# Patient Record
Sex: Male | Born: 1945 | Race: White | Hispanic: No | Marital: Single | Smoking: Former smoker
Health system: Southern US, Community
[De-identification: ages and names within clinical notes are randomized; demographics above are authoritative.]

## PROBLEM LIST (undated history)

## (undated) DIAGNOSIS — I2510 Atherosclerotic heart disease of native coronary artery without angina pectoris: Secondary | ICD-10-CM

## (undated) DIAGNOSIS — I350 Nonrheumatic aortic (valve) stenosis: Secondary | ICD-10-CM

## (undated) DIAGNOSIS — R058 Other specified cough: Secondary | ICD-10-CM

## (undated) DIAGNOSIS — E785 Hyperlipidemia, unspecified: Secondary | ICD-10-CM

## (undated) DIAGNOSIS — R05 Cough: Secondary | ICD-10-CM

## (undated) DIAGNOSIS — K922 Gastrointestinal hemorrhage, unspecified: Secondary | ICD-10-CM

## (undated) DIAGNOSIS — I219 Acute myocardial infarction, unspecified: Secondary | ICD-10-CM

## (undated) DIAGNOSIS — T464X5A Adverse effect of angiotensin-converting-enzyme inhibitors, initial encounter: Secondary | ICD-10-CM

## (undated) DIAGNOSIS — J449 Chronic obstructive pulmonary disease, unspecified: Secondary | ICD-10-CM

## (undated) DIAGNOSIS — E119 Type 2 diabetes mellitus without complications: Secondary | ICD-10-CM

## (undated) DIAGNOSIS — K219 Gastro-esophageal reflux disease without esophagitis: Secondary | ICD-10-CM

## (undated) DIAGNOSIS — I10 Essential (primary) hypertension: Secondary | ICD-10-CM

## (undated) HISTORY — DX: Cough: R05

## (undated) HISTORY — DX: Adverse effect of angiotensin-converting-enzyme inhibitors, initial encounter: T46.4X5A

## (undated) HISTORY — DX: Gastro-esophageal reflux disease without esophagitis: K21.9

## (undated) HISTORY — DX: Other specified cough: R05.8

## (undated) HISTORY — DX: Gastrointestinal hemorrhage, unspecified: K92.2

## (undated) HISTORY — DX: Chronic obstructive pulmonary disease, unspecified: J44.9

## (undated) HISTORY — DX: Acute myocardial infarction, unspecified: I21.9

## (undated) HISTORY — DX: Hyperlipidemia, unspecified: E78.5

## (undated) HISTORY — PX: HAND TENDON SURGERY: SHX663

## (undated) HISTORY — PX: PLANTAR FASCIA SURGERY: SHX746

## (undated) HISTORY — PX: ROTATOR CUFF REPAIR: SHX139

## (undated) HISTORY — PX: CATARACT EXTRACTION, BILATERAL: SHX1313

---

## 2003-11-21 HISTORY — PX: OTHER SURGICAL HISTORY: SHX169

## 2004-08-22 ENCOUNTER — Inpatient Hospital Stay (HOSPITAL_COMMUNITY): Admission: RE | Admit: 2004-08-22 | Discharge: 2004-08-24 | Payer: Self-pay | Admitting: Cardiology

## 2004-09-05 ENCOUNTER — Encounter: Payer: Self-pay | Admitting: Cardiology

## 2004-10-31 ENCOUNTER — Encounter: Payer: Self-pay | Admitting: Cardiology

## 2004-11-01 ENCOUNTER — Ambulatory Visit: Payer: Self-pay | Admitting: Cardiology

## 2004-11-20 HISTORY — PX: CERVICAL SPINE SURGERY: SHX589

## 2004-11-22 ENCOUNTER — Ambulatory Visit: Payer: Self-pay | Admitting: Cardiology

## 2004-11-28 ENCOUNTER — Ambulatory Visit: Payer: Self-pay | Admitting: Cardiology

## 2004-12-23 ENCOUNTER — Ambulatory Visit (HOSPITAL_COMMUNITY): Admission: RE | Admit: 2004-12-23 | Discharge: 2004-12-24 | Payer: Self-pay | Admitting: Orthopaedic Surgery

## 2005-01-20 ENCOUNTER — Ambulatory Visit: Payer: Self-pay | Admitting: Cardiology

## 2008-04-23 ENCOUNTER — Inpatient Hospital Stay (HOSPITAL_COMMUNITY): Admission: EM | Admit: 2008-04-23 | Discharge: 2008-04-25 | Payer: Self-pay | Admitting: Emergency Medicine

## 2008-04-23 ENCOUNTER — Ambulatory Visit: Payer: Self-pay | Admitting: Internal Medicine

## 2008-10-08 ENCOUNTER — Ambulatory Visit (HOSPITAL_COMMUNITY): Admission: RE | Admit: 2008-10-08 | Discharge: 2008-10-08 | Payer: Self-pay | Admitting: Ophthalmology

## 2008-10-22 ENCOUNTER — Ambulatory Visit (HOSPITAL_COMMUNITY): Admission: RE | Admit: 2008-10-22 | Discharge: 2008-10-22 | Payer: Self-pay | Admitting: Ophthalmology

## 2009-10-08 ENCOUNTER — Ambulatory Visit: Payer: Self-pay | Admitting: Pulmonary Disease

## 2009-10-08 DIAGNOSIS — E785 Hyperlipidemia, unspecified: Secondary | ICD-10-CM | POA: Insufficient documentation

## 2009-10-26 ENCOUNTER — Ambulatory Visit: Payer: Self-pay | Admitting: Pulmonary Disease

## 2009-10-29 ENCOUNTER — Encounter: Payer: Self-pay | Admitting: Pulmonary Disease

## 2009-11-01 ENCOUNTER — Telehealth (INDEPENDENT_AMBULATORY_CARE_PROVIDER_SITE_OTHER): Payer: Self-pay | Admitting: *Deleted

## 2009-11-10 ENCOUNTER — Ambulatory Visit: Payer: Self-pay | Admitting: Pulmonary Disease

## 2009-11-10 DIAGNOSIS — F17200 Nicotine dependence, unspecified, uncomplicated: Secondary | ICD-10-CM

## 2009-11-22 ENCOUNTER — Encounter (INDEPENDENT_AMBULATORY_CARE_PROVIDER_SITE_OTHER): Payer: Self-pay | Admitting: *Deleted

## 2009-11-22 ENCOUNTER — Ambulatory Visit: Payer: Self-pay | Admitting: Cardiology

## 2009-11-22 DIAGNOSIS — I2510 Atherosclerotic heart disease of native coronary artery without angina pectoris: Secondary | ICD-10-CM | POA: Insufficient documentation

## 2009-11-22 DIAGNOSIS — I10 Essential (primary) hypertension: Secondary | ICD-10-CM | POA: Insufficient documentation

## 2009-11-25 ENCOUNTER — Ambulatory Visit: Payer: Self-pay | Admitting: Cardiology

## 2009-11-25 ENCOUNTER — Encounter: Payer: Self-pay | Admitting: Cardiology

## 2009-12-07 ENCOUNTER — Telehealth (INDEPENDENT_AMBULATORY_CARE_PROVIDER_SITE_OTHER): Payer: Self-pay | Admitting: *Deleted

## 2009-12-13 ENCOUNTER — Telehealth (INDEPENDENT_AMBULATORY_CARE_PROVIDER_SITE_OTHER): Payer: Self-pay | Admitting: *Deleted

## 2009-12-16 ENCOUNTER — Ambulatory Visit: Payer: Self-pay | Admitting: Cardiology

## 2009-12-20 ENCOUNTER — Encounter: Payer: Self-pay | Admitting: Cardiology

## 2009-12-22 ENCOUNTER — Ambulatory Visit: Payer: Self-pay | Admitting: Pulmonary Disease

## 2009-12-22 DIAGNOSIS — J449 Chronic obstructive pulmonary disease, unspecified: Secondary | ICD-10-CM | POA: Insufficient documentation

## 2009-12-22 DIAGNOSIS — J4489 Other specified chronic obstructive pulmonary disease: Secondary | ICD-10-CM | POA: Insufficient documentation

## 2009-12-23 ENCOUNTER — Telehealth (INDEPENDENT_AMBULATORY_CARE_PROVIDER_SITE_OTHER): Payer: Self-pay | Admitting: *Deleted

## 2010-01-18 ENCOUNTER — Ambulatory Visit: Payer: Self-pay | Admitting: Cardiology

## 2010-01-25 ENCOUNTER — Ambulatory Visit: Payer: Self-pay | Admitting: Pulmonary Disease

## 2010-11-22 ENCOUNTER — Telehealth (INDEPENDENT_AMBULATORY_CARE_PROVIDER_SITE_OTHER): Payer: Self-pay | Admitting: *Deleted

## 2010-12-02 ENCOUNTER — Ambulatory Visit
Admission: RE | Admit: 2010-12-02 | Discharge: 2010-12-02 | Payer: Self-pay | Source: Home / Self Care | Attending: Pulmonary Disease | Admitting: Pulmonary Disease

## 2010-12-22 NOTE — Progress Notes (Signed)
Summary: BP & HR STILL ELEVATED   Phone Note Call from Patient Call back at Home Phone [phone]   Caller: Patient Call For: Hochrein Summary of Call: Patient call and is still concerned that BP and HR being elevated. Yesterday saw Dr. Craige Cotta and BP 160/88 HR-104. Please advise. Initial call taken by: Carlye Grippe,  December 23, 2009 11:16 AM  Follow-up for Phone Call        start amlodipine 5 mg daily. check BP in 2 weeks Follow-up by: Nelida Meuse, PA-C,  December 31, 2009 4:37 PM  Additional Follow-up for Phone Call Additional follow up Details #1::        left message on machine to call office.   Additional Follow-up by: Carlye Grippe,  December 31, 2009 4:50 PM    Additional Follow-up for Phone Call Additional follow up Details #2::    Patient informed of the above.  Follow-up by: Carlye Grippe,  January 03, 2010 9:09 AM  New/Updated Medications: AMLODIPINE BESYLATE 5 MG TABS (AMLODIPINE BESYLATE) Take 1 tablet by mouth once a day Prescriptions: AMLODIPINE BESYLATE 5 MG TABS (AMLODIPINE BESYLATE) Take 1 tablet by mouth once a day  #30 x 6   Entered by:   Carlye Grippe   Authorized by:   Nelida Meuse, PA-C   Signed by:   Carlye Grippe on 01/03/2010   Method used:   Electronically to        [facility] (retail)       [address]       [place], [place]  [address]       Ph: [phone]       Fax: [phone]   RxID:   [id number]

## 2010-12-22 NOTE — Assessment & Plan Note (Signed)
Summary: NURSE BP CHECK/LA  Nurse Visit   Vital Signs:  Patient profile:   65 year old male Height:      66 inches Weight:      177 pounds Pulse rate:   84 / minute BP sitting:   160 / 80  (left arm) Cuff size:   regular  Vitals Entered By: Carlye Grippe (December 16, 2009 9:14 AM) CC: nurse BP check Comments discussed with patient the importance of getting  a PCP. patient verbalized understanding.   ZO:[id number] HTN--yes UJW:JXBJYN meds?--yes Side effects?--no Chest pain, SOB, Dizziness?--no A/P: 1. HTN (401.1)             At goal?              If no, physician will be notified.              Follow up in ...Marland KitchenMarland KitchenMarland Kitchen  5 minutes was spent with the patient.      Serial Vital Signs/Assessments:  Time      Position  BP       Pulse  Resp  Temp     By 9:14 AM             164/84                         Carlye Grippe   Preventive Screening-Counseling & Management  Alcohol-Tobacco     Smoking Status: current     Smoking Cessation Counseling: yes     Packs/Day: 1PPD  Allergies (verified): No Known Drug Allergies  Orders Added: 1)  Est. Patient Level I [82956]  Appended Document: NURSE BP CHECK/LA Please increase lisinopril to 40mg  daily.  Check a BMET in 10 days.  Appended Document: NURSE BP CHECK/LA Patient informed of the above.

## 2010-12-22 NOTE — Assessment & Plan Note (Signed)
Summary: extreme sob/sood pt/apc   Visit Type:  Sick Visit Copy to:  Rollene Rotunda Primary Provider/Referring Provider:  No Primary Care Physician  CC:  Pt of Dr. Craige Cotta. Pt c/o chest tightness, cough, S.O.B at rest and with exertion x 5 days, "fever", and chills. Pt has a hx of URI. Pt still smoking. Pt states has increased use of ProAir to five times a day.Marland Kitchen  History of Present Illness: 65 yo male with Dyspnea with COPD and tobacco abuse.   January 25, 2010 3:17 PM Sick visit Pt c/o chest tightness, cough, S.O.B at rest and with exertion x 5 days, "fever", chills. Pt has a hx of URI. Pt still smoking. Pt states has increased use of ProAir to five times a day. CXR 11/10 wnl Note on lisinopril x 2 months   Current Medications (verified): 1)  Aspirin 325 Mg Tabs (Aspirin) .Marland Kitchen.. 1 By Mouth Daily 2)  Zantac 150 Mg Caps (Ranitidine Hcl) .Marland Kitchen.. 1 By Mouth Daily 3)  Spiriva Handihaler 18 Mcg Caps (Tiotropium Bromide Monohydrate) .... One Puff Once Daily 4)  Proair Hfa 108 (90 Base) Mcg/act Aers (Albuterol Sulfate) .... Two Puffs Up To Four Times Per Day As Needed 5)  Buproban 150 Mg Xr12h-Tab (Bupropion Hcl (Smoking Deter)) .... One By Mouth Once Daily For 3 Days, Then One By Mouth Two Times A Day(As of 12/22/09 Has Not Started.) 6)  Lisinopril 40 Mg Tabs (Lisinopril) .... Take 1 Tablet By Mouth Once A Day 7)  Zocor 20 Mg Tabs (Simvastatin) .... Take 1 Tablet By Mouth Once A Day 8)  Amlodipine Besylate 5 Mg Tabs (Amlodipine Besylate) .... Take 1 Tablet By Mouth Once A Day  Allergies (verified): No Known Drug Allergies  Past History:  Past Medical History: Last updated: 11/22/2009 Myocardial Infarction -- (2005.  Two Cypher stents to the distal circ and OM) Hyperlipidemia COPD      - PFT from 10/26/09: FEV1 2.03(74%), FVC 3.00(77%), FEV1% 68, TLC 7.11(126%), DLCO 70%, no BD GERD Hyperlipidemia   GI bleed (on Plavix and ECASA)  Social History: Last updated: 11/22/2009 Patient is a  current smoker  (1ppd x 40 years) Single Works in Physicist, medical Rare ETOH use  Risk Factors: Smoking Status: current (01/18/2010) Packs/Day: 1 PPD (01/18/2010)  Review of Systems       The patient complains of dyspnea on exertion.  The patient denies anorexia, fever, weight loss, weight gain, vision loss, decreased hearing, hoarseness, chest pain, syncope, peripheral edema, prolonged cough, headaches, hemoptysis, abdominal pain, melena, hematochezia, severe indigestion/heartburn, hematuria, muscle weakness, suspicious skin lesions, difficulty walking, depression, unusual weight change, and abnormal bleeding.    Vital Signs:  Patient profile:   65 year old male Height:      66 inches Weight:      175.50 pounds O2 Sat:      96 % on Room air Temp:     99.9 degrees F oral Pulse rate:   100 / minute BP sitting:   132 / 68  (left arm) Cuff size:   large  Vitals Entered By: Zackery Barefoot CMA (January 25, 2010 3:08 PM)  O2 Flow:  Room air CC: Pt of Dr. Craige Cotta. Pt c/o chest tightness, cough, S.O.B at rest and with exertion x 5 days, "fever", chills. Pt has a hx of URI. Pt still smoking. Pt states has increased use of ProAir to five times a day. Comments Medications reviewed with patient Verified contact number and pharmacy with patient Zackery Barefoot CMA  January 25, 2010 3:09 PM    Physical Exam  Additional Exam:  Gen. Pleasant, well-nourished, in no distress ENT - no lesions, no post nasal drip Neck: No JVD, no thyromegaly, no carotid bruits Lungs: no use of accessory muscles, no dullness to percussion, clear without rales, faint  rhonchi  Cardiovascular: Rhythm regular, heart sounds  normal, no murmurs or gallops, no peripheral edema Musculoskeletal: No deformities, no cyanosis or clubbing      Impression & Recommendations:  Problem # 1:  C O P D WITH ACUTE EXACERBATION (ICD-491.21)  prednsione taper Robitussin for cough. Antibiotic only if phlegm changes  color.  Orders: Est. Patient Level III (04540) Prescription Created Electronically [phone])  Medications Added to Medication List This Visit: 1)  Prednisone 10 Mg Tabs (Prednisone) .... Take 4 tabs  daily with food x 4 days, then 3 tabs daily x 4 days, then 2 tabs daily x 4 days, then 1 tab daily x4 days then stop. #40  Patient Instructions: 1)  Copy sent to: Dr Craige Cotta 2)  Please schedule a follow-up appointment in 1 month. 3)  Take prednisone as directed 4)  Call back if no better in 1 week 5)  Robitussin DM oTC 1-2 tsp q6h as needed cough Prescriptions: PREDNISONE 10 MG TABS (PREDNISONE) Take 4 tabs  daily with food x 4 days, then 3 tabs daily x 4 days, then 2 tabs daily x 4 days, then 1 tab daily x4 days then stop. #40  #40 x 0   Entered and Authorized by:   Comer Locket Vassie Loll MD   Signed by:   Comer Locket Vassie Loll MD on 01/25/2010   Method used:   Electronically to        [facility] (retail)       [address]       [place], [place]  [address]       Ph: [phone]       Fax: [phone]   RxID:   [id number]    Immunization History:  Pneumovax Immunization History:    Pneumovax:  historical (11/20/2006)

## 2010-12-22 NOTE — Letter (Signed)
Summary: Lexiscan or Dobutamine Pharmacist, community at [facility]  [address]   [place], [place] [address]   Phone: [phone]  Fax: [phone]      [facility] Cardiovascular Services  Lexiscan or Dobutamine Cardiolite Strss Test    [facility]  Appointment Date:_  Appointment Time:_  Your doctor has ordered a CARDIOLITE STRESS TEST using a medication to stimulate exercise so that you will not have to walk on the treadmill to determine the condition of your heart during stress. If you take blood pressure medication, ask your doctor if you should take it the day of your test. Its okay to take your meds the day of your test.You should not have anything to eat or drink at least 4 hours before your test is scheduled, and no caffeine, including decaffeinated tea and coffee, chocolate, and soft drinks for 24 hours before your test.  You will need to register at the Outpatient/Main Entrance at the hospital 15 minutes before your appointment time. It is a good idea to bring a copy of your order with you. They will direct you to the Diagnostic Imaging (Radiology) Department.  You will be asked to undress from the waist up and given a hospital gown to wear, so dress comfortably from the waist down for example: Sweat pants, shorts, or skirt Rubber soled lace up shoes (tennis shoes)  Plan on about three hours from registration to release from the hospital

## 2010-12-22 NOTE — Progress Notes (Signed)
Summary: prescription to last until appt  Phone Note Call from Patient   Caller: Patient Call For: dr Craige Cotta Summary of Call: Patient phoned and wanted to make an appointment for an office visit I scheduled him for 12/02/10 but he stated that he would be out of his Spiriva before that and wanted to know if we would call in enough to last until his appointment. [phone] Initial call taken by: Vedia Coffer,  November 22, 2010 4:05 PM  Follow-up for Phone Call        I have sent a month supply to pharmacy and attempted to call patient-phone disconnected- I have called the pharmacy to have them relay message to patient that he MUST keep appt for more refills.Reynaldo Minium CMA  November 22, 2010 5:03 PM     Prescriptions: SPIRIVA HANDIHALER 18 MCG CAPS (TIOTROPIUM BROMIDE MONOHYDRATE) one puff once daily  #30 x 0   Entered by:   Reynaldo Minium CMA   Authorized by:   Coralyn Helling MD   Signed by:   Reynaldo Minium CMA on 11/22/2010   Method used:   Electronically to        [facility] (retail)       [address]       [place], [place]  [address]       Ph: [phone]       Fax: [phone]   RxID:   [id number]

## 2010-12-22 NOTE — Assessment & Plan Note (Signed)
Summary: NURSE BP CHECK PER GENE/LA  Nurse Visit   Vital Signs:  Patient profile:   65 year old male Height:      66 inches Weight:      174 pounds Pulse rate:   97 / minute BP sitting:   146 / 71  (left arm) Cuff size:   large  Vitals Entered By: Carlye Grippe (January 18, 2010 8:49 AM)  Visit Type:  nurse BP check  CC:  nurse BP check.  CC: nurse BP check Comments encouraged patient to stop smoking. EA:[id number] HTN--yes JYN:WGNFAO meds?--yes Side effects?--no Chest pain, SOB, Dizziness?--no A/P: 1. HTN (401.1)             At goal?              If no, physician will be notified.              Follow up in ...Marland KitchenMarland KitchenMarland Kitchen  5 minutes was spent with the patient.       Preventive Screening-Counseling & Management  Alcohol-Tobacco     Smoking Status: current     Smoking Cessation Counseling: yes     Packs/Day: 1 PPD  Current Medications (verified): 1)  Aspirin 325 Mg Tabs (Aspirin) .Marland Kitchen.. 1 By Mouth Daily 2)  Zantac 150 Mg Caps (Ranitidine Hcl) .Marland Kitchen.. 1 By Mouth Daily 3)  Spiriva Handihaler 18 Mcg Caps (Tiotropium Bromide Monohydrate) .... One Puff Once Daily 4)  Proair Hfa 108 (90 Base) Mcg/act Aers (Albuterol Sulfate) .... Two Puffs Up To Four Times Per Day As Needed 5)  Buproban 150 Mg Xr12h-Tab (Bupropion Hcl (Smoking Deter)) .... One By Mouth Once Daily For 3 Days, Then One By Mouth Two Times A Day(As of 12/22/09 Has Not Started.) 6)  Lisinopril 40 Mg Tabs (Lisinopril) .... Take 1 Tablet By Mouth Once A Day 7)  Zocor 20 Mg Tabs (Simvastatin) .... Take 1 Tablet By Mouth Once A Day 8)  Amlodipine Besylate 5 Mg Tabs (Amlodipine Besylate) .... Take 1 Tablet By Mouth Once A Day  Allergies (verified): No Known Drug Allergies  Comments:  Nurse/Medical Assistant: The patient's medications and allergies were reviewed with the patient and were updated in the Medication and Allergy Lists. Verbally gave names.  Orders Added: 1)  Est. Patient Level I [13086]

## 2010-12-22 NOTE — Assessment & Plan Note (Signed)
Summary: [name]   Copy to:  Rollene Rotunda Primary Provider/Referring Provider:     CC:  noticed a little sob at rest out of spiriva x 1 week, more labored breathing, will need refills spiriva and Proair.  Started smoking again 3 mos ago, and would like a refill of Buproban.  History of Present Illness: 65 yo male with Dyspnea with COPD and tobacco abuse.  He ran out of spiriva.  He has noticed his breathing getting worse since.  He is using his proair two times a day now.  He started smoking again.  He smokes 1 pack in 3 days.  He has some cough with clear sputum.  He is not wheezing much.   Preventive Screening-Counseling & Management  Alcohol-Tobacco     Smoking Status: current  Current Medications (verified): 1)  Aspirin 325 Mg Tabs (Aspirin) .Marland Kitchen.. 1 By Mouth Daily 2)  Zantac 150 Mg Caps (Ranitidine Hcl) .Marland Kitchen.. 1 By Mouth Daily 3)  Spiriva Handihaler 18 Mcg Caps (Tiotropium Bromide Monohydrate) .... One Puff Once Daily 4)  Proair Hfa 108 (90 Base) Mcg/act Aers (Albuterol Sulfate) .... Two Puffs Up To Four Times Per Day As Needed 5)  Buproban 150 Mg Xr12h-Tab (Bupropion Hcl (Smoking Deter)) .... One By Mouth Once Daily For 3 Days, Then One By Mouth Two Times A Day(As of 12/22/09 Has Not Started.) 6)  Lisinopril 40 Mg Tabs (Lisinopril) .... Take 1 Tablet By Mouth Once A Day 7)  Zocor 20 Mg Tabs (Simvastatin) .... Take 1 Tablet By Mouth Once A Day 8)  Amlodipine Besylate 5 Mg Tabs (Amlodipine Besylate) .... Take 1 Tablet By Mouth Once A Day  Allergies (verified): No Known Drug Allergies  Past History:  Past Medical History: Myocardial Infarction -- (2005.  Two Cypher stents to the distal circ and OM) Hyperlipidemia COPD      - PFT from 10/26/09: FEV1 2.03(74%), FEV1% 68, TLC 7.11(126%), DLCO 70%, no BD GERD Hyperlipidemia   GI bleed (on Plavix and ECASA)  Vital Signs:  Patient profile:   65 year old male Height:      66 inches Weight:      186.25 pounds BMI:      30.17 O2 Sat:      97 % on Room air Temp:     98.4 degrees F oral Pulse rate:   93 / minute BP sitting:   120 / 78  (left arm) Cuff size:   large  Vitals Entered By: Kandice Hams CMA (December 02, 2010 11:59 AM)  O2 Flow:  Room air CC: noticed a little sob at rest out of spiriva x 1 week, more labored breathing, will need refills spiriva and Proair.  Started smoking again 3 mos ago, would like a refill of Buproban   Physical Exam  General:  obese.   Nose:  no deformity, discharge, inflammation, or lesions Mouth:  no deformity or lesions Neck:  no JVD.   Lungs:  diminished breath sounds, prolonged exhalation, no wheezing or rales Heart:  regular rhythm, normal rate, and no murmurs.   Extremities:  no edema, cyanosis, or clubbing Neurologic:  normal CN II-XII and strength normal.   Cervical Nodes:  no significant adenopathy Psych:  alert and cooperative; normal mood and affect; normal attention span and concentration   Impression & Recommendations:  Problem # 1:  COPD (ICD-496) Will restart his spiriva and continue proair.  Advised him to call if he runs out of his prescriptions.  Problem # 2:  TOBACCO  ABUSE (ICD-305.1) Had detailed discussion about his renewed smoking is affecting his health.  Will start him on bupropion again.  Explained the dosing schedule.  Medications Added to Medication List This Visit: 1)  Buproban 150 Mg Xr12h-tab (Bupropion hcl (smoking deter)) .... One by mouth once daily for 3 days, then one by mouth two times a day  Complete Medication List: 1)  Aspirin 325 Mg Tabs (Aspirin) .Marland Kitchen.. 1 by mouth daily 2)  Zantac 150 Mg Caps (Ranitidine hcl) .Marland Kitchen.. 1 by mouth daily 3)  Spiriva Handihaler 18 Mcg Caps (Tiotropium bromide monohydrate) .... One puff once daily 4)  Proair Hfa 108 (90 Base) Mcg/act Aers (Albuterol sulfate) .... Two puffs up to four times per day as needed 5)  Buproban 150 Mg Xr12h-tab (Bupropion hcl (smoking deter)) .... One by mouth once daily  for 3 days, then one by mouth two times a day 6)  Lisinopril 40 Mg Tabs (Lisinopril) .... Take 1 tablet by mouth once a day 7)  Zocor 20 Mg Tabs (Simvastatin) .... Take 1 tablet by mouth once a day 8)  Amlodipine Besylate 5 Mg Tabs (Amlodipine besylate) .... Take 1 tablet by mouth once a day  Other Orders: Est. Patient Level III ([address]) Tobacco use cessation intensive >10 minutes (60454)  Patient Instructions: 1)  Follow up in 3 months Prescriptions: PROAIR HFA 108 (90 BASE) MCG/ACT AERS (ALBUTEROL SULFATE) two puffs up to four times per day as needed  #1 x 6   Entered and Authorized by:   Coralyn Helling MD   Signed by:   Coralyn Helling MD on 12/02/2010   Method used:   Print then Give to Patient   RxID:   [id number] SPIRIVA HANDIHALER 18 MCG CAPS (TIOTROPIUM BROMIDE MONOHYDRATE) one puff once daily  #30 x 6   Entered and Authorized by:   Coralyn Helling MD   Signed by:   Coralyn Helling MD on 12/02/2010   Method used:   Print then Give to Patient   RxID:   [id number] BUPROBAN 150 MG XR12H-TAB (BUPROPION HCL (SMOKING DETER)) one by mouth once daily for 3 days, then one by mouth two times a day  #60 x 3   Entered and Authorized by:   Coralyn Helling MD   Signed by:   Coralyn Helling MD on 12/02/2010   Method used:   Print then Give to Patient   RxID:   [id number] PROAIR HFA 108 (90 BASE) MCG/ACT AERS (ALBUTEROL SULFATE) two puffs up to four times per day as needed  #3 x 3   Entered and Authorized by:   Coralyn Helling MD   Signed by:   Coralyn Helling MD on 12/02/2010   Method used:   Print then Give to Patient   RxID:   [id number] SPIRIVA HANDIHALER 18 MCG CAPS (TIOTROPIUM BROMIDE MONOHYDRATE) one puff once daily  #90 x 3   Entered and Authorized by:   Coralyn Helling MD   Signed by:   Coralyn Helling MD on 12/02/2010   Method used:   Print then Give to Patient   RxID:   [id number] BUPROBAN 150 MG XR12H-TAB (BUPROPION HCL (SMOKING DETER)) one by mouth once daily for 3 days, then one by  mouth two times a day(as of 12/22/09 has not started.)  #60 x 3   Entered and Authorized by:   Coralyn Helling MD   Signed by:   Coralyn Helling MD on 12/02/2010   Method used:   Print then Give to Patient  RxID:   [id number] PROAIR HFA 108 (90 BASE) MCG/ACT AERS (ALBUTEROL SULFATE) two puffs up to four times per day as needed  #1 x 6   Entered and Authorized by:   Coralyn Helling MD   Signed by:   Coralyn Helling MD on 12/02/2010   Method used:   Print then Give to Patient   RxID:   [id number] SPIRIVA HANDIHALER 18 MCG CAPS (TIOTROPIUM BROMIDE MONOHYDRATE) one puff once daily  #30 x 6   Entered and Authorized by:   Coralyn Helling MD   Signed by:   Coralyn Helling MD on 12/02/2010   Method used:   Print then Give to Patient   RxID:   [id number]

## 2010-12-22 NOTE — Assessment & Plan Note (Signed)
Summary: 6-8 weeks/apc   Copy to:  Rollene Rotunda  CC:  Pt here for follow up. Pt c/o S.O.B with activity, chest tightness, and feeling tired. Pt c/o elevated BP readings. Pt states is smoking at least a half pack daily and has not started Buproban..  History of Present Illness: 65 yo male with Dyspnea with COPD and tobacco abuse.  He hwas seen by cardiology.  His blood pressure is still on the high side.  His breathing was doing better until the last 2 days.  He feels like he is coming down with a cold.  He has been feeling tired, and has a cough.  He is bringing up the same amount of sputum, and denies hemoptysis.  He has not had fever or chills.  He denies chest pain.  His sinuses are okay.  He is using his proair two times a day.  He continues to smoke cigarettes.   Current Medications (verified): 1)  Aspirin 325 Mg Tabs (Aspirin) .Marland Kitchen.. 1 By Mouth Daily 2)  Zantac 150 Mg Caps (Ranitidine Hcl) .Marland Kitchen.. 1 By Mouth Daily 3)  Spiriva Handihaler 18 Mcg Caps (Tiotropium Bromide Monohydrate) .... One Puff Once Daily 4)  Proair Hfa 108 (90 Base) Mcg/act Aers (Albuterol Sulfate) .... Two Puffs Up To Four Times Per Day As Needed 5)  Buproban 150 Mg Xr12h-Tab (Bupropion Hcl (Smoking Deter)) .... One By Mouth Once Daily For 3 Days, Then One By Mouth Two Times A Day(As of 12/22/09 Has Not Started.) 6)  Lisinopril 40 Mg Tabs (Lisinopril) .... Take 1 Tablet By Mouth Once A Day 7)  Zocor 20 Mg Tabs (Simvastatin) .... Take 1 Tablet By Mouth Once A Day  Allergies (verified): No Known Drug Allergies  Past History:  Past Medical History: Reviewed history from 11/22/2009 and no changes required. Myocardial Infarction -- (2005.  Two Cypher stents to the distal circ and OM) Hyperlipidemia COPD      - PFT from 10/26/09: FEV1 2.03(74%), FVC 3.00(77%), FEV1% 68, TLC 7.11(126%), DLCO 70%, no BD GERD Hyperlipidemia   GI bleed (on Plavix and ECASA)  Past Surgical History: Reviewed history from 11/22/2009  and no changes required. Coronary stenting  --  2005 C6-C7 Neck surgery -- 2006 Cataract, bilateral Left shoulder rotater cuff Right foot (plantar fascia) Left hand "tendon" surgery  Vital Signs:  Patient profile:   65 year old male Height:      66 inches Weight:      179 pounds O2 Sat:      95 % on Room air Temp:     97.2 degrees F oral Pulse rate:   104 / minute BP sitting:   160 / 88  (left arm) Cuff size:   regular  Vitals Entered By: Zackery Barefoot CMA (December 22, 2009 1:48 PM)  O2 Flow:  Room air CC: Pt here for follow up. Pt c/o S.O.B with activity, chest tightness, feeling tired. Pt c/o elevated BP readings. Pt states is smoking at least a half pack daily and has not started Buproban. Comments Medications reviewed with patient Verified pt's contact number Zackery Barefoot CMA  December 22, 2009 1:49 PM    Physical Exam  General:  obese.   Nose:  no deformity, discharge, inflammation, or lesions Mouth:  no deformity or lesions Neck:  no JVD.   Lungs:  diminished breath sounds, prolonged exhalation, no wheezing or rales Heart:  regular rhythm, normal rate, and no murmurs.   Abdomen:  obese, soft, non-tender Extremities:  no edema, cyanosis, or clubbing Cervical Nodes:  no significant adenopathy   Impression & Recommendations:  Problem # 1:  COPD (ICD-496) Will continue on current inhaler regimen.  Problem # 2:  TOBACCO ABUSE (ICD-305.1)  Advised him to start bupropion.  Orders: Est. Patient Level III ([address])  Problem # 3:  CAD (ICD-414.00) He is schedule for ROV with cardiology in one year.  He is still having trouble with his blood pressure.  Advised him to contact cardiology office to determine if f/u appointment needed sooner.  Problem # 4:  URI (ICD-465.9)  He has mild upper respiratory symptoms likely from a virus.  I don't think he needs xray, antibiotics, or prednisone at present.  Orders: Est. Patient Level III (60[address])  Medications  Added to Medication List This Visit: 1)  Buproban 150 Mg Xr12h-tab (Bupropion hcl (smoking deter)) .... One by mouth once daily for 3 days, then one by mouth two times a day(as of 12/22/09 has not started.)  Complete Medication List: 1)  Aspirin 325 Mg Tabs (Aspirin) .Marland Kitchen.. 1 by mouth daily 2)  Zantac 150 Mg Caps (Ranitidine hcl) .Marland Kitchen.. 1 by mouth daily 3)  Spiriva Handihaler 18 Mcg Caps (Tiotropium bromide monohydrate) .... One puff once daily 4)  Proair Hfa 108 (90 Base) Mcg/act Aers (Albuterol sulfate) .... Two puffs up to four times per day as needed 5)  Buproban 150 Mg Xr12h-tab (Bupropion hcl (smoking deter)) .... One by mouth once daily for 3 days, then one by mouth two times a day(as of 12/22/09 has not started.) 6)  Lisinopril 40 Mg Tabs (Lisinopril) .... Take 1 tablet by mouth once a day 7)  Zocor 20 Mg Tabs (Simvastatin) .... Take 1 tablet by mouth once a day  Patient Instructions: 1)  Follow up in 3 months

## 2010-12-22 NOTE — Progress Notes (Signed)
Summary: BP still elevated  Phone Note Call from Patient Call back at Home Phone [phone] Call back at [phone]   Caller: Patient Call For: nurse Summary of Call:  Patient c/o that BP still elevated at 185/95. He recently started lisinopril 10mg . Please advise. Uses [facility]. Initial call taken by: Carlye Grippe,  December 07, 2009 4:36 PM  Follow-up for Phone Call        Patient called back today and said that his  BP yesterday was 185/105. Follow-up by: Carlye Grippe,  December 09, 2009 3:27 PM  Additional Follow-up for Phone Call Additional follow up Details #1::        Spoke with Dr. Antoine Poche and informed him of elevated BP. MD increased lisinopril to 20mg  daily and nurse BP check in one week. called and informed friend Nicole Cella since patient not at work or home. Left message to have patient call office to shedule Nurse BP check. Additional Follow-up by: Carlye Grippe,  December 09, 2009 4:35 PM    New/Updated Medications: LISINOPRIL 20 MG TABS (LISINOPRIL) Take 1 tablet by mouth once a day Prescriptions: LISINOPRIL 20 MG TABS (LISINOPRIL) Take 1 tablet by mouth once a day  #30 x 6   Entered by:   Carlye Grippe   Authorized by:   Rollene Rotunda, MD, [facility]   Signed by:   Carlye Grippe on 12/09/2009   Method used:   Electronically to        [facility]* (retail)       [address]       [place], [place]  [address]       Ph: [phone]       Fax: [phone]   RxID:   [id number]

## 2010-12-22 NOTE — Progress Notes (Signed)
Summary: BP STILL ELEVATED  Phone Note Call from Patient Call back at Home Phone [phone]   Caller: Patient Call For: nurse Summary of Call: patient left message on machine tha his BP still elevated 196/94 and HR-105. nurse informed him to come to our office for Nurse BP check.   MD informed. Initial call taken by: Carlye Grippe,  December 13, 2009 5:03 PM

## 2010-12-22 NOTE — Assessment & Plan Note (Signed)
Summary: OLD EST PAT FROM 2006   Visit Type:  Follow-up Primary Provider:  No Primary Care Physician  CC:  CAD and Dyspnea.  History of Present Illness: The patient presents for evaluation of his known coronary disease and dyspnea.   He had a history of an inferior myocardial infarction treated with stenting.  He he has not been seen by a cardiologist in several years. He does have chronic dyspnea and has been following with a pulmonologist. However, a recent pulmonary function tests suggested mild COPD perhaps out of proportion to his dyspnea.  He reports that he will get short of breath walking 25 feet up an incline. He will have to stop what he is doing and it might take 10-15 minutes before he can catch his breath. He does not describe chest pressure, or jaw discomfort that he had at the time of his heart attack. He does not get arm discomfort. He does not describe nausea, vomiting or excessive diaphoresis. However, he does report that his dyspnea is progressive over the past year. He is not describing PND or orthopnea. Of note he does have episodes of acute shortness of breath with coughing that he has had episodically over the years. He describes episodes that sound in some respects like there is an element of anxiety or panic. However, he has learned to control these events for the most part. He is getting more dyspneic with his activities of daily living. He denies any weight gain or swelling is not having any cough productive of sputum.  Preventive Screening-Counseling & Management  Alcohol-Tobacco     Smoking Status: current     Smoking Cessation Counseling: yes     Packs/Day: 1/2 PPD  Current Medications (verified): 1)  Aspirin 325 Mg Tabs (Aspirin) .Marland Kitchen.. 1 By Mouth Daily 2)  Zantac 150 Mg Caps (Ranitidine Hcl) .Marland Kitchen.. 1 By Mouth Daily 3)  Spiriva Handihaler 18 Mcg Caps (Tiotropium Bromide Monohydrate) .... One Puff Once Daily 4)  Proair Hfa 108 (90 Base) Mcg/act Aers (Albuterol Sulfate)  .... Two Puffs Up To Four Times Per Day As Needed 5)  Buproban 150 Mg Xr12h-Tab (Bupropion Hcl (Smoking Deter)) .... One By Mouth Once Daily For 3 Days, Then One By Mouth Two Times A Day  Allergies (verified): No Known Drug Allergies  Comments:  Nurse/Medical Assistant: The patient's medications and allergies were reviewed with the patient and were updated in the Medication and Allergy Lists. Patient brought list to office.  Past History:  Past Medical History: Myocardial Infarction -- (2005.  Two Cypher stents to the distal circ and OM) Hyperlipidemia COPD      - PFT from 10/26/09: FEV1 2.03(74%), FVC 3.00(77%), FEV1% 68, TLC 7.11(126%), DLCO 70%, no BD GERD Hyperlipidemia   GI bleed (on Plavix and ECASA)  Past Surgical History: Coronary stenting  --  2005 C6-C7 Neck surgery -- 2006 Cataract, bilateral Left shoulder rotater cuff Right foot (plantar fascia) Left hand "tendon" surgery  Family History: Heart disease -- mother, brother, MGM, MGF, PGM, PGF Mother CHF 10 Brother CABG 18s  Social History: Patient is a current smoker  (1ppd x 40 years) Single Works in Physicist, medical Rare ETOH usePacks/Day:  1/2 PPD  Review of Systems       As stated in the HPI and negative for all other systems.   Vital Signs:  Patient profile:   65 year old male Height:      66 inches Weight:      177 pounds Pulse rate:  92 / minute BP sitting:   194 / 115  (right arm) Cuff size:   regular  Vitals Entered By: Carlye Grippe (November 22, 2009 2:43 PM)  Serial Vital Signs/Assessments:  Time      Position  BP       Pulse  Resp  Temp     By 2:49 PM   Sitting   182/115  92                    Lydia Anderson 3:31 PM             200/104                        Carlye Grippe  CC: CAD, Dyspnea   Physical Exam  General:  Well developed, well nourished, in no acute distress. Head:  normocephalic and atraumatic Eyes:  PERRLA/EOM intact; conjunctiva and lids normal. Mouth:  Teeth,  gums and palate normal. Oral mucosa normal. Neck:  Neck supple, no JVD. No masses, thyromegaly or abnormal cervical nodes. Chest Wall:  no deformities or breast masses noted Lungs:  Clear bilaterally to auscultation and percussion. Abdomen:  Bowel sounds positive; abdomen soft and non-tender without masses, organomegaly, or hernias noted. No hepatosplenomegaly, obese Msk:  Back normal, normal gait. Muscle strength and tone normal. Extremities:  No clubbing or cyanosis. Neurologic:  Alert and oriented x 3. Skin:  Intact without lesions or rashes. Cervical Nodes:  no significant adenopathy Axillary Nodes:  no significant adenopathy Inguinal Nodes:  no significant adenopathy Psych:  Normal affect.   Detailed Cardiovascular Exam  Neck    Carotids: Carotids full and equal bilaterally without bruits.      Neck Veins: Normal, no JVD.    Heart    Inspection: no deformities or lifts noted.      Palpation: normal PMI with no thrills palpable.      Auscultation: regular rate and rhythm, S1, S2 without murmurs, rubs, gallops, or clicks.    Vascular    Abdominal Aorta: no palpable masses, pulsations, or audible bruits.      Femoral Pulses: normal femoral pulses bilaterally.      Pedal Pulses: normal pedal pulses bilaterally.      Radial Pulses: normal radial pulses bilaterally.      Peripheral Circulation: no clubbing, cyanosis, or edema noted with normal capillary refill.     EKG  Procedure date:  11/22/2009  Findings:      Sinus rate 87, Old Inferior Infarct, No Acute ST-T wave Changes.  Impression & Recommendations:  Problem # 1:  DYSPNEA (ICD-786.05) The the patient has dyspnea with known coronary disease. Given this this must be considered an anginal equivalent. His dyspnea is out of proportion to the degree of abnormality on his pulmonary function testing. I will screen him with a stress perfusion study. He does not think he could walk on a treadmill.  He will have a  pharmacologic stress perfusion study. Orders: EKG w/ Interpretation (93000) T-Basic Metabolic Panel ([id number]) Nuclear Med (Nuc Med)  Problem # 2:  CAD (ICD-414.00) As above. Orders: T-Basic Metabolic Panel [id number]) Nuclear Med (Nuc Med)  Problem # 3:  TOBACCO ABUSE (ICD-305.1) He was recently given a prescription for Wellbutrin and will begin this. We discussed the importance of smoking cessation.  Problem # 4:  ESSENTIAL HYPERTENSION, BENIGN (ICD-401.1) He reports that his blood pressure has not been elevated and in fact I verified this by looking at  the readings and his pulmonary appointments. However, his blood pressure was high and verified on repeat readings in the office. I did give him a prescription for lisinopril 10 mg daily discussing with him angioedema and cough as potential side effects. He will try to keep an eye on his blood pressure at home. Further adjustments will be based on future readings. Orders: T-Basic Metabolic Panel [id number])  Problem # 5:  HYPERLIPIDEMIA (ICD-272.4) He came off of Lipitor because of cost in the past. I have convinced him to take simvastatin 20 mg daily. He will get a lipid profile and liver enzymes in 8 weeks.  Patient Instructions: 1)  Your physician recommends that you return for lab work in: in [facility] weeks at [facility]. 2)  Your physician has recommended you make the following change in your medication: START LISINOPRIL AND SIMVASTATIN. 3)  Your physician has requested that you have an lexiscan myoview.  For further information please visit [email or web address]  Please follow instruction sheet, as given. 4)  Your physician wants you to follow-up in:1year.  You will receive a reminder letter in the mail about two months in advance. If you don't receive a letter, please call our office to schedule the follow-up appointment. Prescriptions: ZOCOR 20 MG TABS (SIMVASTATIN) Take 1 tablet by mouth once a day  #90 x 6   Entered by:    Carlye Grippe   Authorized by:   Rollene Rotunda, MD, [facility]   Signed by:   Carlye Grippe on 11/22/2009   Method used:   Electronically to        [facility]* (retail)       [address]       [place], [place]  [address]       Ph: [phone]       Fax: [phone]   RxID:   [id number] LISINOPRIL 10 MG TABS (LISINOPRIL) Take 1 tablet by mouth once a day  #30 x 6   Entered by:   Carlye Grippe   Authorized by:   Rollene Rotunda, MD, [facility]   Signed by:   Carlye Grippe on 11/22/2009   Method used:   Electronically to        [facility]* (retail)       [address]       [place], [place]  [address]       Ph: [phone]       Fax: [phone]   RxID:   [id number]

## 2010-12-28 ENCOUNTER — Telehealth (INDEPENDENT_AMBULATORY_CARE_PROVIDER_SITE_OTHER): Payer: Self-pay | Admitting: *Deleted

## 2011-01-04 ENCOUNTER — Telehealth: Payer: Self-pay | Admitting: Pulmonary Disease

## 2011-01-04 ENCOUNTER — Encounter: Payer: Self-pay | Admitting: Adult Health

## 2011-01-04 ENCOUNTER — Ambulatory Visit (INDEPENDENT_AMBULATORY_CARE_PROVIDER_SITE_OTHER): Payer: Medicare Other | Admitting: Adult Health

## 2011-01-04 DIAGNOSIS — J441 Chronic obstructive pulmonary disease with (acute) exacerbation: Secondary | ICD-10-CM

## 2011-01-05 NOTE — Progress Notes (Signed)
Summary: REQUEST FOR SIMVASTATIN REFILL  ---- Converted from flag ---- ---- 12/28/2010 11:46 AM, [name], CNA wrote: pt need refills on simvastatin ------------------------------  Phone Note Outgoing Call Call back at [facility] Phone [phone]   Call placed by: Carlye Grippe,  December 28, 2010 1:08 PM Call placed to: Patient Summary of Call: left message on machine to call office r/e need for f/u and need to know who is checking lipid labs.  Initial call taken by: Carlye Grippe,  December 28, 2010 1:09 PM  Follow-up for Phone Call        patient said he will be going to get medical services from [place] and they will be managing his cholesterol. Follow-up by: Carlye Grippe,  December 28, 2010 4:34 PM    Prescriptions: ZOCOR 20 MG TABS (SIMVASTATIN) Take 1 tablet by mouth once a day  #30 x 2   Entered by:   Carlye Grippe   Authorized by:   Rollene Rotunda, MD, [facility]   Signed by:   Carlye Grippe on 12/28/2010   Method used:   Electronically to        [facility]* (retail)       [address]       [place], [place]  [address]       Ph: [phone]       Fax: [phone]   RxID:   [id number]

## 2011-01-11 NOTE — Progress Notes (Signed)
Summary: pt wants to be seen this am he is in the lobby  Phone Note Call from Patient   Caller: Patient Call For: Sadiel Mota Summary of Call: patient came in he went to the ER at [facility] due to he cant get a good breath. He is wheezing. The ER told him he was having an anexity attack and gave him valium to take when he got home. he has had  11 attacks of copd since yesterday. He is in the lobby and wants to be seen this morning because he lives 40 minutes away Initial call taken by: Vedia Coffer,  January 04, 2011 8:42 AM  Follow-up for Phone Call        Spoke with Raliegh Scarlet and Dot Lanes in reference to pt c/o SOB and demanding to be seen by a provider. Pt of Dr. Craige Cotta who is not in the office until this PM. Spoke with Katheren Shams who advised okay to add to TP schedule. Zackery Barefoot CMA  January 04, 2011 9:05 AM

## 2011-01-11 NOTE — Assessment & Plan Note (Signed)
Summary: Acute NP office visit - COPD   Copy to:  Rollene Rotunda Primary Provider/Referring Provider:  No Primary Care Physician  CC:  increased SOB, wheezing, prod cough with white mucus x2days - went to ED at [facility], and was given valium and medrol dose pak that pt has not started.  History of Present Illness: 65 yo male with Dyspnea with COPD and tobacco abuse.  January 04, 2011 --Presents for an acute office visit. Complains of increased SOB, wheezing, productive cough with white mucus x2days. He went to ED at [facility] last night was given valium and medrol dose pak that pt has not started. Woke up 2 days ago with wheeizng, now worse at night.Cough is mainly dry, has "wheezing in throat-rattle that he hears when breathing". Was told he did not have Pnuemonia on xray last night . Given steroid taper. -that he has not started.. Denies chest pain,  orthopnea, hemoptysis,  n/v/d, edema, headache,recent travel or antibiotics. He is still smoking.  Medications Prior to Update: 1)  Aspirin 325 Mg Tabs (Aspirin) .Marland Kitchen.. 1 By Mouth Daily 2)  Zantac 150 Mg Caps (Ranitidine Hcl) .Marland Kitchen.. 1 By Mouth Daily 3)  Spiriva Handihaler 18 Mcg Caps (Tiotropium Bromide Monohydrate) .... One Puff Once Daily 4)  Buproban 150 Mg Xr12h-Tab (Bupropion Hcl (Smoking Deter)) .... One By Mouth Once Daily For 3 Days, Then One By Mouth Two Times A Day 5)  Zocor 20 Mg Tabs (Simvastatin) .... Take 1 Tablet By Mouth Once A Day 6)  Lisinopril 40 Mg Tabs (Lisinopril) .... Take 1 Tablet By Mouth Once A Day 7)  Amlodipine Besylate 5 Mg Tabs (Amlodipine Besylate) .... Take 1 Tablet By Mouth Once A Day 8)  Proair Hfa 108 (90 Base) Mcg/act Aers (Albuterol Sulfate) .... Two Puffs Up To Four Times Per Day As Needed  Current Medications (verified): 1)  Aspirin 325 Mg Tabs (Aspirin) .Marland Kitchen.. 1 By Mouth Daily 2)  Zantac 150 Mg Caps (Ranitidine Hcl) .Marland Kitchen.. 1 By Mouth Daily 3)  Spiriva Handihaler 18 Mcg Caps (Tiotropium Bromide Monohydrate) ....  One Puff Once Daily 4)  Proair Hfa 108 (90 Base) Mcg/act Aers (Albuterol Sulfate) .... Two Puffs Up To Four Times Per Day As Needed 5)  Buproban 150 Mg Xr12h-Tab (Bupropion Hcl (Smoking Deter)) .... One By Mouth Once Daily For 3 Days, Then One By Mouth Two Times A Day 6)  Lisinopril 40 Mg Tabs (Lisinopril) .... Take 1 Tablet By Mouth Once A Day 7)  Zocor 20 Mg Tabs (Simvastatin) .... Take 1 Tablet By Mouth Once A Day 8)  Amlodipine Besylate 5 Mg Tabs (Amlodipine Besylate) .... Take 1 Tablet By Mouth Once A Day 9)  Tussin 100 Mg/45ml Syrp (Guaifenesin) .... Per Bottle  Allergies (verified): No Known Drug Allergies  Past History:  Past Medical History: Last updated: 12/02/2010 Myocardial Infarction -- (2005.  Two Cypher stents to the distal circ and OM) Hyperlipidemia COPD      - PFT from 10/26/09: FEV1 2.03(74%), FEV1% 68, TLC 7.11(126%), DLCO 70%, no BD GERD Hyperlipidemia   GI bleed (on Plavix and ECASA)  Past Surgical History: Last updated: 11/22/2009 Coronary stenting  --  2005 C6-C7 Neck surgery -- 2006 Cataract, bilateral Left shoulder rotater cuff Right foot (plantar fascia) Left hand "tendon" surgery  Family History: Last updated: 11/22/2009 Heart disease -- mother, brother, MGM, MGF, PGM, PGF Mother CHF 48 Brother CABG 75s  Social History: Last updated: 01/04/2011 Patient is a current smoker  (1ppd x 40 years) 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 use declines flu shot 2.15.12  Risk Factors: Smoking Status: current (12/02/2010) Packs/Day: 1 PPD (01/18/2010)  Social History: Patient is a current smoker  (1ppd x 40 years) Single Works in Physicist, medical Rare ETOH use declines flu shot 2.15.12  Review of Systems      See HPI  Vital Signs:  Patient profile:   65 year old male Height:      66 inches Weight:      187.31 pounds BMI:     30.34 O2 Sat:      96 % on Room air Temp:     97.5 degrees F oral Pulse rate:   99 / minute BP sitting:   132 / 82   (right arm) Cuff size:   regular  Vitals Entered By: Boone Master CNA/MA (January 04, 2011 9:21 AM)  O2 Flow:  Room air CC: increased SOB, wheezing, prod cough with white mucus x2days - went to ED at [facility], was given valium and medrol dose pak that pt has not started Is Patient Diabetic? No Comments Medications reviewed with patient Daytime contact number verified with patient. Boone Master CNA/MA  January 04, 2011 9:22 AM    Physical Exam  Additional Exam:  GEN: A/Ox3; pleasant , NAD HEENT:  [place]/AT, , EACs-clear, TMs-wnl, NOSE-clear, THROAT-clear NECK:  Supple w/ fair ROM; no JVD; normal carotid impulses w/o bruits; no thyromegaly or nodules palpated; no lymphadenopathy. RESP  Coarse BS w/ faint exp wheeze, loud upper airway psuedowheeze  CARD:  RRR, no m/r/g   GI:   Soft & nt; nml bowel sounds; no organomegaly or masses detected. [place]: Warm bil,  no calf tenderness edema, clubbing, pulses intact Neuro: no focal deficits noted.    Impression & Recommendations:  Problem # 1:  C O P D WITH ACUTE EXACERBATION (ICD-491.21)  Exacerbation in active smoker with upper airway irritation ?from ACE.  His psuedowheezing is very pronounced with barking cough- will hold ACE temporiarly  with plans for PCP or cards to decide on continuation after flare is resolved.  Neb tx in office with albuterol  Plan:  Begin Prednisone taper . Hold Lisinopril for now. Begin Diovan 160mg  by mouth once daily.  Delsym 2 tsp every 12hr as needed cough. Please contact office for sooner follow up if symptoms do not improve or worsen  follow up 1-2 weeks Dr.Sood or Mikeala Girdler   Orders: Est. Patient Level IV ([address])  Medications Added to Medication List This Visit: 1)  Diovan 160 Mg Tabs (Valsartan) .Marland Kitchen.. 1 by mouth once daily -- to take while lisinopril on hold 2)  Tussin 100 Mg/37ml Syrp (Guaifenesin) .... Per bottle 3)  Hold Lisinopril 40 Mg Tabs (lisinopril)-hold  .... Take 1 tablet by mouth once  a day 4)  Prednisone 10 Mg Tabs (Prednisone) .... 4 tabs for 2 days, then 3 tabs for 2 days, 2 tabs for 2 days, then 1 tab for 2 days, then stop  Complete Medication List: 1)  Aspirin 325 Mg Tabs (Aspirin) .Marland Kitchen.. 1 by mouth daily 2)  Zantac 150 Mg Caps (Ranitidine hcl) .Marland Kitchen.. 1 by mouth daily 3)  Spiriva Handihaler 18 Mcg Caps (Tiotropium bromide monohydrate) .... One puff once daily 4)  Buproban 150 Mg Xr12h-tab (Bupropion hcl (smoking deter)) .... One by mouth once daily for 3 days, then one by mouth two times a day 5)  Zocor 20 Mg Tabs (Simvastatin) .... Take 1 tablet by mouth once a day 6)  Diovan 160 Mg  Tabs (Valsartan) .Marland Kitchen.. 1 by mouth once daily -- to take while lisinopril on hold 7)  Amlodipine Besylate 5 Mg Tabs (Amlodipine besylate) .... Take 1 tablet by mouth once a day 8)  Tussin 100 Mg/88ml Syrp (Guaifenesin) .... Per bottle 9)  Proair Hfa 108 (90 Base) Mcg/act Aers (Albuterol sulfate) .... Two puffs up to four times per day as needed 10)  Hold Lisinopril 40 Mg Tabs (lisinopril)-hold  .... Take 1 tablet by mouth once a day 11)  Prednisone 10 Mg Tabs (Prednisone) .... 4 tabs for 2 days, then 3 tabs for 2 days, 2 tabs for 2 days, then 1 tab for 2 days, then stop  Patient Instructions: 1)  Begin Prednisone taper . 2)  Hold Lisinopril for now. 3)  Begin Diovan 160mg  by mouth once daily.  4)  Delsym 2 tsp every 12hr as needed cough. 5)  Please contact office for sooner follow up if symptoms do not improve or worsen  6)  follow up 1-2 weeks Dr.Sood or Kumar Falwell  Prescriptions: PREDNISONE 10 MG TABS (PREDNISONE) 4 tabs for 2 days, then 3 tabs for 2 days, 2 tabs for 2 days, then 1 tab for 2 days, then stop  #20 x 0   Entered and Authorized by:   Rubye Oaks NP   Signed by:   Rubye Oaks NP on 01/04/2011   Method used:   Electronically to        [facility] (retail)       [address]       [place], [place]  [address]       Ph:  [phone]       Fax: [phone]   RxID:   [id number]

## 2011-01-19 ENCOUNTER — Ambulatory Visit (INDEPENDENT_AMBULATORY_CARE_PROVIDER_SITE_OTHER): Payer: Medicare Other | Admitting: Pulmonary Disease

## 2011-01-19 ENCOUNTER — Encounter: Payer: Self-pay | Admitting: Pulmonary Disease

## 2011-01-19 DIAGNOSIS — I10 Essential (primary) hypertension: Secondary | ICD-10-CM

## 2011-01-19 DIAGNOSIS — J449 Chronic obstructive pulmonary disease, unspecified: Secondary | ICD-10-CM

## 2011-01-19 DIAGNOSIS — F17200 Nicotine dependence, unspecified, uncomplicated: Secondary | ICD-10-CM

## 2011-01-26 NOTE — Assessment & Plan Note (Addendum)
Summary: rov 2 wks ///kp   Copy to:  Rollene Rotunda Primary Provider/Referring Provider:  No Primary Care Physician  CC:  2 week follow up. Pt states his breathing is much better. Pt states he has a cough but not as bad as before. Pt states he stills has a little laryngitis and some sore throat.  History of Present Illness: 65 yo male with Dyspnea with COPD and tobacco abuse.  He was seen by [facility] 02/15.  He was given prednisone, and had ACE inhibitor stopped.  He also stopped smoking one week ago.  He has been feeling better with his breathing.  He still has a tickle in his throat.  He has some sinus congestion.  He is not using his albuterol much.   Current Medications (verified): 1)  Aspirin 325 Mg Tabs (Aspirin) .Marland Kitchen.. 1 By Mouth Daily 2)  Zantac 150 Mg Caps (Ranitidine Hcl) .Marland Kitchen.. 1 By Mouth Daily 3)  Spiriva Handihaler 18 Mcg Caps (Tiotropium Bromide Monohydrate) .... One Puff Once Daily 4)  Buproban 150 Mg Xr12h-Tab (Bupropion Hcl (Smoking Deter)) .... One By Mouth Once Daily For 3 Days, Then One By Mouth Two Times A Day 5)  Zocor 20 Mg Tabs (Simvastatin) .... Take 1 Tablet By Mouth Once A Day 6)  Diovan 160 Mg Tabs (Valsartan) .Marland Kitchen.. 1 By Mouth Once Daily -- To Take While Lisinopril On Hold 7)  Amlodipine Besylate 5 Mg Tabs (Amlodipine Besylate) .... Take 1 Tablet By Mouth Once A Day 8)  Tussin 100 Mg/66ml Syrp (Guaifenesin) .... Per Bottle 9)  Proair Hfa 108 (90 Base) Mcg/act Aers (Albuterol Sulfate) .... Two Puffs Up To Four Times Per Day As Needed 10)  Hold Lisinopril 40 Mg Tabs (Lisinopril)-Hold .... Take 1 Tablet By Mouth Once A Day  Allergies (verified): No Known Drug Allergies  Past History:  Past Medical History: Myocardial Infarction -- (2005.  Two Cypher stents to the distal circ and OM) Hyperlipidemia COPD      - PFT from 10/26/09: FEV1 2.03(74%), FEV1% 68, TLC 7.11(126%), DLCO 70%, no BD GERD Hyperlipidemia   GI bleed (on Plavix and ECASA) ACE inhibitor  cough  Past Surgical History: Reviewed history from 11/22/2009 and no changes required. Coronary stenting  --  2005 C6-C7 Neck surgery -- 2006 Cataract, bilateral Left shoulder rotater cuff Right foot (plantar fascia) Left hand "tendon" surgery  Social History: Patient is a current smoker  (1ppd x 40 years). Pt states he quit 12/2010 Single Works in Physicist, medical Rare ETOH use declines flu shot 2.15.12  Vital Signs:  Patient profile:   65 year old male Height:      66 inches Weight:      183.38 pounds BMI:     29.71 O2 Sat:      96 % on Room air Temp:     98.1 degrees F oral Pulse rate:   88 / minute BP sitting:   128 / 62  (left arm) Cuff size:   regular  Vitals Entered By: Carver Fila (January 19, 2011 9:01 AM)  O2 Flow:  Room air CC: 2 week follow up. Pt states his breathing is much better. Pt states he has a cough but not as bad as before. Pt states he stills has a little laryngitis and some sore throat Comments meds and allergies updated Phone number updated Mindy Silva  January 19, 2011 9:03 AM    Physical Exam  General:  obese.   Nose:  no deformity, discharge, inflammation,  or lesions Mouth:  no deformity or lesions Neck:  no JVD.   Lungs:  diminished breath sounds, prolonged exhalation, no wheezing or rales Heart:  regular rhythm, normal rate, and no murmurs.   Extremities:  no edema, cyanosis, or clubbing Neurologic:  normal CN II-XII.   Cervical Nodes:  no significant adenopathy   Impression & Recommendations:  Problem # 1:  COPD (ICD-496) He has recovered from his recent exacerbation.  Problem # 2:  TOBACCO ABUSE (ICD-305.1)  He has stopped smoking in Feb.  Will continue Bupropion.  Problem # 3:  ESSENTIAL HYPERTENSION, BENIGN (ICD-401.1)  Likely had ACE cough.  Will conitnue diovan.  Medications Added to Medication List This Visit: 1)  Diovan 160 Mg Tabs (Valsartan) .Marland Kitchen.. 1 by mouth once daily  Complete Medication List: 1)  Spiriva Handihaler  18 Mcg Caps (Tiotropium bromide monohydrate) .... One puff once daily 2)  Proair Hfa 108 (90 Base) Mcg/act Aers (Albuterol sulfate) .... Two puffs up to four times per day as needed 3)  Aspirin 325 Mg Tabs (Aspirin) .Marland Kitchen.. 1 by mouth daily 4)  Zantac 150 Mg Caps (Ranitidine hcl) .Marland Kitchen.. 1 by mouth daily 5)  Buproban 150 Mg Xr12h-tab (Bupropion hcl (smoking deter)) .... One by mouth once daily for 3 days, then one by mouth two times a day 6)  Zocor 20 Mg Tabs (Simvastatin) .... Take 1 tablet by mouth once a day 7)  Diovan 160 Mg Tabs (Valsartan) .Marland Kitchen.. 1 by mouth once daily 8)  Amlodipine Besylate 5 Mg Tabs (Amlodipine besylate) .... Take 1 tablet by mouth once a day 9)  Tussin 100 Mg/65ml Syrp (Guaifenesin) .... Per bottle  Other Orders: Est. Patient Level III ([address])  Patient Instructions: 1)  Stop Lisinopril 2)  Continue Diovan 3)  Continue Buproban 4)  Follow up in 3 months Prescriptions: DIOVAN 160 MG TABS (VALSARTAN) 1 by mouth once daily  #90 x 3   Entered and Authorized by:   Coralyn Helling MD   Signed by:   Coralyn Helling MD on 01/19/2011   Method used:   Print then Give to Patient   RxID:   [id number] DIOVAN 160 MG TABS (VALSARTAN) 1 by mouth once daily  #30 x 1   Entered and Authorized by:   Coralyn Helling MD   Signed by:   Coralyn Helling MD on 01/19/2011   Method used:   Print then Give to Patient   RxID:   [id number]

## 2011-03-30 ENCOUNTER — Other Ambulatory Visit: Payer: Self-pay | Admitting: *Deleted

## 2011-03-30 MED ORDER — SIMVASTATIN 20 MG PO TABS: 20.0000 mg | ORAL_TABLET | Freq: Every evening | ORAL | Status: DC

## 2011-04-04 NOTE — H&P (Signed)
NAMEWILBON, OBENCHAIN NO.:  [id number]   MEDICAL RECORD NO.:  [id number]          PATIENT TYPE:  EMS   LOCATION:  MAJO                         FACILITY:  [facility]   PHYSICIAN:  Pricilla Riffle, MD, FACCDATE OF BIRTH:  Nov 07, 1946   DATE OF ADMISSION:  04/23/2008  DATE OF DISCHARGE:                              HISTORY & PHYSICAL   HISTORY:  [name] is a 65 year old white male who was referred from  [facility] secondary to chest tightness.  Mr.  [name] states that since yesterday mid day he has gradually developed  increasing cough productive of a little phlegm and he is not clear as to  what color.  He has progressively become more and more short of breath  with worsening cough.  This afternoon it was particularly worse with  increased wheezing.  Thus driving on the way home, he decided to stop in  [facility] for assessment.  On his arrival  there, his temperature was 101 and his blood pressure was 209/104.  He  reported chest tightness.  However, the patient says yes his chest is  tight but it is because he is unable to get a deep breath.  His  discomfort does not resemble his prior myocardial infarction.  The  discomfort is not pleuritic.   While assessing in the emergency room, he had an acute onset of severe  respiratory distress with audible wheezing and sats dropped down to 60%.  I questioned the accuracy, given his delayed reperfusion with his  fingertips.  His symptoms improved post breathing treatment.   ALLERGIES:  No known drug allergies.   MEDICATIONS:  Prior to admission include aspirin 325 mg daily and Zantac  150 mg b.i.d.   PAST MEDICAL HISTORY:  Notable for GERD, hyperlipidemia, unknown last  check.  He also had an inferior myocardial infarction with drug-eluting  stents placed to the circumflex x2.  Catheterization at that time showed  nonobstructive residual coronary artery disease.  EF was  preserved.  He  has had multiple surgeries which have included a neck fusion and left  rotator cuff repair.  Past medical history is also notable for left hand  surgery, right eye detached retina, and foot surgery secondary to  plantar fasciitis.  He specifically denies diabetes, hypertension, CVA,  COPD, bleeding dyscrasias, renal dysfunction, or thyroid disorder.   SOCIAL HISTORY:  He resides in [place] with a friend, Nicole Cella and her  son.  He is employed with [employer].  He continues to smoke a pack or  more a day for 30 plus years.  He denies any alcohol for at least 4  months.  Denies any drugs, herbal medications, diet.  He states that he  remains very active outside yard work, doing yard work, but he does not  have any regular exercise program.   FAMILY HISTORY:  Mother is deceased at the age of 67 with CHF and  hypertension.  Father is deceased at age 21 with history of Alzheimer  and hypertension.  He has 3 brothers.  They are all  younger than him.  One has had bypass surgery at the age of 24 and 2 other brothers are  blind secondary to retinitis pigmentosa.  His father also had that.   REVIEW OF SYSTEMS:  The patient states that he has lost some weight  recently but he is not sure how much except that his belt notch is down  1 hole.  He describes chronic sinus congestion, glasses.  He states that  he does have hearing loss and needs aids; however, he does not have  them.  He has very poor dentition, has not seen a dentist in quite a  while.  He describes chronic dyspnea on exertion.  He also has issues in  regards to financial stress, some left shoulder hip and finger  arthralgias.  All other systems are unremarkable.   PHYSICAL EXAMINATION:  GENERAL:  Well-nourished, well-developed pleasant  white male who is now comfortable after his acute respiratory  insufficiency.  VITAL SIGNS:  Temperature is 99.4, blood pressure 135/68, pulse 86 and  regular, respirations 20.  Sat  is 95% on room air.  HEENT:  Unremarkable except for poor dentition.  NECK:  Supple without thyromegaly, adenopathy, JVD, or carotid bruits.  CHEST:  Symmetrical excursion.  LUNGS:  Sounds are very, very diminished with little air movement.  He  has rhonchi and wheezes.  Post breathing treatment, the rhonchi and  wheezing have improved.  HEART:  PMI is not displaced.  Regular rate and rhythm.  Heart sounds  are distant.  Do not appreciate any murmurs, rubs, clicks, or gallops.  All pulses are symmetrical and intact.  Do not appreciate abdominal or  femoral bruits.  SKIN:  Integument appears to be intact.  ABDOMEN:  Obese.  Bowel sounds present without organomegaly, masses, or  tenderness.  EXTREMITIES:  Negative cyanosis, clubbing, or edema.  MUSCULOSKELETAL:  Unremarkable.  NEUROLOGIC:  Unremarkable.   LABORATORY DATA:  Chest x-ray shows mild basilar atelectasis.  EKG from  [facility] shows sinus tachycardia, no acute changes.  EKG here in the emergency room shows normal sinus rhythm, normal axis,  inferior Q-waves.  No acute changes.  H&H is 15.0 and 42.8, platelet is  234.  WBC is 17.4 with evidence of a left shift.  Indices were normal.  Other labs are pending at the time of dictation.   IMPRESSION:  Possible early pneumonia plus or minus chronic obstructive  pulmonary disease exacerbation.  He is febrile with left shift and  elevated WBCs.  Hypertension with blood pressure rise during [facility] of 209/104.  Tobacco use.  History as noted per past  medical history.   DISPOSITION:  Dr. Tenny Craw reviewed the patient's history, spoke with, and  examined the patient and agrees with the above.  We will admit him to  step-down given his acute respiratory distress that occurred in the  emergency room.  This did respond to nebulizers and additional O2.  We  will begin IV antibiotics, continue him on nebulizers including  Pulmicort, and given 1 dose of  Solu-Medrol.  We will continue his home  medications, avoid beta-blockers, given his history of respiratory  distress and exam findings.  Further recommendations will be based on  treatment response.  At this time, I do not see any cardiac etiology to  his symptoms.      Joellyn Rued, PA-C      Pricilla Riffle, MD, [facility]  Electronically Signed    EW/MEDQ  D:  04/23/2008  T:  04/24/2008  Job:  [id number]   cc:   Learta Codding, MD,FACC  [facility]

## 2011-04-05 ENCOUNTER — Telehealth: Payer: Self-pay | Admitting: *Deleted

## 2011-04-05 ENCOUNTER — Other Ambulatory Visit: Payer: Self-pay | Admitting: *Deleted

## 2011-04-05 MED ORDER — VALSARTAN 160 MG PO TABS: 160.0000 mg | ORAL_TABLET | Freq: Every day | ORAL | Status: DC

## 2011-04-05 NOTE — Telephone Encounter (Signed)
Left message for patient to call office.  

## 2011-04-05 NOTE — Telephone Encounter (Signed)
Message copied by Carlye Grippe on Wed Apr 05, 2011 10:04 AM ------      Message from: Hoover Brunette      Created: Wed Apr 05, 2011  9:49 AM                   ----- Message -----         From: Gerilyn Pilgrim. Tuck, CNA         Sent: 04/05/2011   9:41 AM           To: Nolen Mu Refill            We received a refill request for his simvastatin 20mg  1 po daily [facility] [place] He is an [place] pt.      Thanks,      [employer]

## 2011-04-07 NOTE — Discharge Summary (Signed)
[name], [name]                  ACCOUNT NO.:  [id number]   MEDICAL RECORD NO.:  [id number]          PATIENT TYPE:  INP   LOCATION:  2019                         FACILITY:  [facility]   PHYSICIAN:  Charlies Constable, M.D. [facility] DATE OF BIRTH:  Jul 05, 1946   DATE OF ADMISSION:  08/22/2004  DATE OF DISCHARGE:  08/24/2004                           DISCHARGE SUMMARY - REFERRING   DISCHARGE DIAGNOSES:  1.  Diaphragmatic myocardial infarction, status post drug eluting stent to      the circumflex.  2.  Normal ejection fraction.  3.  Tobacco abuse, smoking cessation counseling provided.  4.  Hyperlipidemia, treated.  5.  Gastroesophageal reflux disease.   HOSPITAL COURSE:  [name] is a 65 year old male patient who developed  sudden subdural chest pain about 11 a.m.  while working in the yard.  This  was accompanied by diaphoresis, shortness of breath and nausea.  He  presented to the emergency room where the EKG revealed evidence of an acute  inferior infarct with hyperacute ST segment elevation with basically  tombstone signs in leads II, III and aVF.  He received 600 mg of Plavix,  morphine as well as nitroglycerin and he was transferred acutely to [facility] for cardiac catheterization and coronary  intervention.   The procedure was performed by Dr. Charlies Constable.  The patient had a total  circumflex distally and two drug eluting stents were placed to this area.  The PL branch had a 90% stenosis and this area underwent a PTCA.  The  patient was placed in the APEX trial.   During the patient's hospitalization, he had borderline blood pressures with  a systolic around 100 and beta-blockers were withheld at that point.  Our  goal would be to begin beta-blockers as an outpatient if blood pressure  allows [place] to do so.   At this point, the patient is stable and is prepared for discharge on  August 24, 2004.   DISCHARGE MEDICATIONS:  1.  Enteric coated aspirin 325 mg a  day.  2.  Plavix 75 mg a day.  3.  Lipitor 80 mg q.h.s.  4.  Sublingual nitroglycerin p.r.n. chest pain.  5.  He will utilize a nicotine patch beginning at 21 mg per hour daily.   ACTIVITY:  No straining or lifting over 10 pounds for one week.  He needs to  exercise only as per cardiac rehabilitation.   DIET:  Remain on a low fat diet.   WOUND CARE:  Clean over catheterization with soap and water.  No scrubbing.  No smoking.  Call for any questions or concerns.   FOLLOW UP:  He is to follow up with Dr. Ival Bible office on September 05, 2004, at 11 a.m.       LB/MEDQ  D:  08/23/2004  T:  08/23/2004  Job:  [id number]   cc:   [facility]  [address]  [place], [place] [address]   Darrol Angel, D.O.

## 2011-04-07 NOTE — Discharge Summary (Signed)
[name], [name]                  ACCOUNT NO.:  [id number]   MEDICAL RECORD NO.:  [id number]          PATIENT TYPE:  INP   LOCATION:  6712                         FACILITY:  [facility]   PHYSICIAN:  Wilson Singer, M.D.DATE OF BIRTH:  1946/03/14   DATE OF ADMISSION:  04/23/2008  DATE OF DISCHARGE:  04/25/2008                               DISCHARGE SUMMARY   FINAL DISCHARGE DIAGNOSIS:  Exacerbation of chronic obstructive  pulmonary disease.   CONDITION ON DISCHARGE:  Stable.   MEDICATIONS ON DISCHARGE:  1. Zantac 150 mg daily.  2. Aspirin 325 mg daily.  3. Avelox 400 mg daily for 3 days.  4. Tapering dose of prednisone.  5. Pulmicort inhaler.  6. Albuterol inhaler 2 puffs q.i.d. p.r.n.  7. Simvastatin 40 mg daily.   Follow up with [name]. [name], MD, FCCP.   HISTORY:  This 65 year old man was admitted with chest tightness and  shortness of breath with cough productive of some sputum.  Please see  initial history and physical examination by Dr. Dietrich Pates.   HOSPITAL PROGRESS:  The initial presentation was of chest tightness, and  it was felt that this was cardiac in origin, and the patient was  therefore admitted under cardiologist.  However, it was fairly clear  that the main issue was really not cardiac, but more respiratory and the  chest tightness was due to wheezing and COPD exacerbation.  Serial  cardiac enzymes were negative, and the day I took over the care of the  patient, he was doing very well and was breathing much easier with  treatment already initiated by the cardiologist.  On the day of  discharge, his vital signs were stable with a temperature of 96.8, blood  pressure 154/85, pulse 82, saturation 98% on room air.  Heart sounds are  present and normal without any murmurs.  Lung fields were clear with  very minimal wheezing.  Investigation showed a sodium of 139, potassium  3.5, bicarbonate 24, BUN 17, creatinine 1.08, hemoglobin 14.0, white  blood cell  count 15.4, and platelets 207.   FURTHER DISPOSITION:  The patient was able to be discharged home, and he  has been recommended to follow up with Pulmonary Dr. [name] and also  with his primary care physician.      Wilson Singer, M.D.  Electronically Signed     NCG/MEDQ  D:  06/12/2008  T:  06/12/2008  Job:  [id number]

## 2011-04-07 NOTE — Op Note (Signed)
NAMEDEMETRUIS, DEPAUL                  ACCOUNT NO.:  [id number]   MEDICAL RECORD NO.:  [id number]          PATIENT TYPE:  OIB   LOCATION:  5007                         FACILITY:  [facility]   PHYSICIAN:  Mark C. Ophelia Charter, M.D.    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   OPERATION:  Anterior cervical discectomy and fusion left iliac crest bone  graft and plating.   SURGEON:  Mark C. Ophelia Charter, M.D.   ASSISTANT:  Virgina Jock, RNFA   ANESTHESIA:  GOT.   ESTIMATED BLOOD LOSS:  50 mL.   DRAINS:  One Hemovac.   PROCEDURE:  After induction of general anesthesia and oral endotracheal  intubation, head halter traction application, arms tucked to the side with  padding, gel bag underneath the iliac crest and underneath the thoracic  spine, the neck was prepped with DuraPrep as well as the left iliac crest  anterior region.  The area was spread with towels, sterile skin marker used  on the neck, Betadine ViDrape applied after being cut in half.  A sterile  Mayo stand, thyroid sheet.  An incision was made starting at the midline and  extending to the left.  The platysma was split in line with the fibers.  Blunt dissection down to the C6-C7 level was performed.  A needle was placed  at 5-6 and confirmed with cross table lateral fluoroscopy spot film.  Discectomy was performed at C6-C7, the operating microscope was brought in  and as the posterior longitudinal ligament was taken down, there were disc  fragments on the right side and minimal on the left with nerve root  compression.  The endplate was thin and was mobile so that the remaining  bone plug which is slightly offset  anteriorly, all that bone was removed  that was attached to C7 posteriorly.  This showed complete visualization of  the cord.  After irrigation, the uncovertebral joints were stripped.  A 10  mm hole had been drilled and a 12 mm plug was then harvested from the iliac  crest and impacted into the space with traction applied by the nurse  anesthetist.  After it was impacted, it was counter sunk 1 mm.  A Synthes  gold plate was selected, titanium.  Initial plate, which was 18, was a  little bit long, a shorter plate was selected, the next size down, this had  a good fit.  Holes were drilled after checking the AP and lateral.  All four  screw holes were filled.  Set screws were inserted and tightened down.  After irrigation, a Hemovac was placed through a separate stab incision  with in and out technique.  The platysma was closed with 3-0 Vicryl and 4-0  Vicryl subcuticular closure.  The fascia over the iliac crest  was closed  with 0 Vicryl, 2-0 Vicryl, and staples.  Marcaine infiltration in both  areas.  Irrigation and postop dressing, soft cervical collar.  Instrument  counts and needle counts were correct.      MCY/MEDQ  D:  12/23/2004  T:  12/23/2004  Job:  [id number]

## 2011-04-07 NOTE — Cardiovascular Report (Signed)
NAMEMISHA, [name] NO.:  [id number]   MEDICAL RECORD NO.:  [id number]          PATIENT TYPE:  OUT   LOCATION:  CATH                         FACILITY:  [facility]   PHYSICIAN:  Charlies Constable, M.D. [facility] DATE OF BIRTH:  28-Aug-1946   DATE OF PROCEDURE:  08/22/2004  DATE OF DISCHARGE:                              CARDIAC CATHETERIZATION   CLINICAL HISTORY:  [name] is 65 years old and has no prior history of  known heart disease.  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  He had the onset of chest pain at 10:30 and went to [facility]  Emergency Room where his ECG showed acute inferior ST elevation.  He was  given Plavix, aspirin and Integrilin and heparin and transferred to [place] for  intervention via [facility].   SURGEON:   DESCRIPTION OF PROCEDURE:  The procedure was performed with the right  femoral arteries and arterial sheath and 6 French pigtail coronary  catheters.  A frontal arteriogram was performed and Omnipaque contrast was  used.  After completing the diagnostic study, made decision to do  intervention in the circumflex artery.   The patient was enrolled in the APEX trial and was randomized to APEX study  drug which was given as an IV bolus followed by infusion initiated prior to  PCI.  We gave additional heparin and put on the ACT greater than 200 seconds  and we continued the Integrilin.  He had been given Plavix at [facility].  We  used a Q4 6 [place] guiding catheter with side holes and a short soft [place]ahi  wire.  We crossed the lesion in the distal circumflex artery with the wire  without too much difficulty.  We then dilated with a 2.25 x 20 mm Maverick  performing two inflations up to 8 atmospheres for 30 seconds.  The patient  then became quite hypotensive and bradycardic requiring treatment with  atropine and dopamine.  His blood pressure and heart rate stabilized and we  proceeded with the intervention.  We passed a second wire down  a  posterolateral branch which had a 90% ostial stenosis and was located within  the lesion in the distal circumflex artery.  We dilated this with a 2.25 x  20 mm Maverick performing one inflation up to 4 atmospheres for 30 seconds.  We then deployed a 2.5 x 28 mm CYPHER stent inflated with one inflation of  10 atmospheres for 30 seconds.  We then post dilated with a 2.5 x 20 mm  Quantum Maverick performing one inflation up to 16 atmospheres for 30  seconds.  We then placed a second 3.0 x 8 mm CYPHER stent just overlapping  the first stent.  We inflated this with one inflation of 14 atmospheres for  30 seconds.  We post dilated with a 3.25 x 50 mm Quantum Maverick performing  two inflations up to 15 atmospheres for 30 seconds.  Repeat diagnostic  studies were then performed through the guiding catheter.   We took a picture of the right femoral artery but the entry site  was below  the bifurcation so we were not able to close the artery.   The left ventricular pressure was 119/15, aortic pressure was 119/67, mean  of 82.   ANGIOGRAPHIC RESULTS:  Left main coronary artery:  The left main coronary  artery was free of significant disease.   Left anterior descending coronary artery:  The left anterior descending  artery gave rise to three diagonal branches and three septal perforators.  The LAD was irregular but there was no major obstruction.  However, there  was TIMI-2 flow down the vessel.   Circumflex artery:  The circumflex artery was a dominant vessel that gave  rise to three marginal branches and then was completely occluded distally.  A posterolateral branch filled faintly by collaterals.   Right coronary artery:  The right coronary artery is a dominant vessel that  supplied two right ventricular branches.  These vessels were free of  significant disease.   Left ventriculogram:  The left ventriculogram was performed in the RAO  projection and showed hypokinesis of the inferior  wall.  The overall wall  motion was good with an estimated ejection fraction of 55%.   Following PTCA and placement of tandem overlying stents in the mid to distal  circumflex artery stenosis improved from 100% to 0% and the flow improved  from TIMI-0 to TIMI-3 flow.  The posterolateral side branch, which was a  bifurcation lesion, improved from 90% to less than 10% following balloon  inflation and regressed 40% following stent implantation.   CONCLUSION:  1.  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  2.  Successful percutaneous coronary intervention of the distal circumflex      branch stenosis with improvement in the distal circumflex stenosis from      100% to 0% and improvement in the flow from TIMI-0 to TIMI-3 flow using      tandem overlying CYPHER  stents and improvement in the posterolateral      branch (bifurcation of lesion) with percutaneous transluminal coronary      angioplasty from 90% to 40%.   DISPOSITION:  The patient returned to __________ for further observation.  The patient had the onset of chest pain at 10:30 a.m. and arrived at  [facility] Emergency Room at 11:35 a.m.  He arrived at [facility] [facility] [employer] [facility] Catheter[facility] at 1407 p.m. and the first balloon  inflation was performed at 1429.  The __________ balloon time was two hours  and 54 minutes and reperfusion time was  three hours and 59 minutes.  The patient was enrolled in the APEX trial and will be continued on study  drug infusion for 24 hours.  Will  classify him as low risk and target  discharge on day #2.  I do not believe the lesions in the circumflex artery  are severe enough or enlarged enough vessels to warrant intervention.       BB/MEDQ  D:  08/22/2004  T:  08/22/2004  Job:  [id number]   cc:   Jonelle Sidle, M.D. [facility]   Learta Codding, M.D. [facility]

## 2011-05-17 ENCOUNTER — Other Ambulatory Visit: Payer: Self-pay | Admitting: *Deleted

## 2011-05-17 MED ORDER — AMLODIPINE BESYLATE 5 MG PO TABS: 5.0000 mg | ORAL_TABLET | Freq: Every day | ORAL | Status: DC

## 2011-07-12 ENCOUNTER — Other Ambulatory Visit: Payer: Self-pay | Admitting: *Deleted

## 2011-07-12 MED ORDER — AMLODIPINE BESYLATE 5 MG PO TABS: 5.0000 mg | ORAL_TABLET | Freq: Every day | ORAL | Status: DC

## 2011-07-20 ENCOUNTER — Telehealth: Payer: Self-pay | Admitting: Pulmonary Disease

## 2011-07-20 NOTE — Telephone Encounter (Signed)
Spoke with pt and advised him VS has no sooner apts. Pt is scheduled to come in 9/21 at 3:00

## 2011-08-10 ENCOUNTER — Encounter: Payer: Self-pay | Admitting: Pulmonary Disease

## 2011-08-11 ENCOUNTER — Ambulatory Visit (INDEPENDENT_AMBULATORY_CARE_PROVIDER_SITE_OTHER): Payer: Medicare Other | Admitting: Pulmonary Disease

## 2011-08-11 ENCOUNTER — Ambulatory Visit (INDEPENDENT_AMBULATORY_CARE_PROVIDER_SITE_OTHER)
Admission: RE | Admit: 2011-08-11 | Discharge: 2011-08-11 | Disposition: A | Discharge status: Home / Self Care | Payer: Medicare Other | Source: Ambulatory Visit | Attending: Pulmonary Disease | Admitting: Pulmonary Disease

## 2011-08-11 ENCOUNTER — Encounter: Payer: Self-pay | Admitting: Pulmonary Disease

## 2011-08-11 DIAGNOSIS — R05 Cough: Secondary | ICD-10-CM

## 2011-08-11 DIAGNOSIS — Z23 Encounter for immunization: Secondary | ICD-10-CM

## 2011-08-11 DIAGNOSIS — J449 Chronic obstructive pulmonary disease, unspecified: Secondary | ICD-10-CM

## 2011-08-11 DIAGNOSIS — R059 Cough, unspecified: Secondary | ICD-10-CM | POA: Insufficient documentation

## 2011-08-11 DIAGNOSIS — E785 Hyperlipidemia, unspecified: Secondary | ICD-10-CM

## 2011-08-11 DIAGNOSIS — I10 Essential (primary) hypertension: Secondary | ICD-10-CM

## 2011-08-11 MED ORDER — MOMETASONE FUROATE 50 MCG/ACT NA SUSP: 2.0000 | Freq: Every day | NASAL | Status: DC

## 2011-08-11 MED ORDER — SIMVASTATIN 20 MG PO TABS: 20.0000 mg | ORAL_TABLET | Freq: Every evening | ORAL | Status: DC

## 2011-08-11 MED ORDER — AMLODIPINE BESYLATE 5 MG PO TABS: 5.0000 mg | ORAL_TABLET | Freq: Every day | ORAL | Status: AC

## 2011-08-11 NOTE — Progress Notes (Signed)
Subjective:    Patient ID: [name], male    DOB: [birth year], 65 y.o.   MRN: [id number]  HPI Copy to: Rollene Rotunda  65 yo male with Dyspnea with COPD and tobacco abuse.  He quit smoking 7 months ago.  He felt bupropion helped.  He has since stopped this.    After he quit smoking he was able to do more activity.  His cough also stopped.  He was able to laugh again w/o coughing.    He noticed that his cough started again about one month ago.  He is still using spiriva, and feels this helps.  He has sinus congestion with post-nasal drip.  He is bringing up clear to brown sputum when he coughs.  He denies fever or hemoptysis.  He uses robitussin, and this helps some.  He feels congested and tight in mid chest at times.  Past Medical History  Diagnosis Date  . Myocardial infarction   . Hyperlipidemia   . COPD (chronic obstructive pulmonary disease)   . GERD (gastroesophageal reflux disease)   . GI bleed     on Plavix and ECASA  . ACE-inhibitor cough     Family History  Problem Relation Age of Onset  . Heart disease      History   Social History  . Marital Status: Single   Social History Main Topics  . Smoking status: Former Smoker -- 3.0 packs/day for 50 years    Types: Cigarettes    Quit date: 12/21/2010   No Known Allergies   Review of Systems     Objective:   Physical Exam  BP 132/76  Pulse 96  Ht 5' 5.5" (1.664 m)  Wt 186 lb 3.2 oz (84.46 kg)  BMI 30.51 kg/m2  SpO2 94%  General: obese.  Nose: no deformity, clear drainage  Mouth: no deformity or lesions  Neck: no JVD.  Lungs: diminished breath sounds, prolonged exhalation, no wheezing or rales  Heart: regular rhythm, normal rate, and no murmurs.  Extremities: no edema, cyanosis, or clubbing  Neurologic: normal CN II-XII.  Cervical Nodes: no significant adenopathy     Assessment & Plan:   Cough He has noticed increased cough recently.  He has post-nasal drip>>will try him on nasonex.  Will have  him do chest xray and PFT also.  COPD Continue spiriva.  Will determine if he needs adjustment to his inhaler regimen after review of his PFT and CXR.  He got his influenza vaccine today.  Essential hypertension, benign I sent a 90 script for amlodipine to [facility], and advised him to f/u with primary care and cardiology for further refills.  HYPERLIPIDEMIA I sent a 90 script for simvastatin to [facility], and advised him to f/u with primary care and cardiology for further refills.    Updated Medication List Outpatient Encounter Prescriptions as of 08/11/2011  Medication Sig Dispense Refill  . albuterol (PROVENTIL HFA;VENTOLIN HFA) 108 (90 BASE) MCG/ACT inhaler Inhale 2 puffs into the lungs every 6 (six) hours as needed.        Marland Kitchen allopurinol (ZYLOPRIM) 100 MG tablet Take 100 mg by mouth daily.        Marland Kitchen amLODipine (NORVASC) 5 MG tablet Take 1 tablet (5 mg total) by mouth daily.  90 tablet  0  . aspirin 325 MG tablet Take 325 mg by mouth daily.        Marland Kitchen guaifenesin (ROBITUSSIN) 100 MG/5ML syrup Take 200 mg by mouth 3 (three) times daily as needed.        Marland Kitchen  losartan (COZAAR) 100 MG tablet 1/2 tablet daily       . ranitidine (ZANTAC) 150 MG capsule Take 150 mg by mouth 2 (two) times daily.        . simvastatin (ZOCOR) 20 MG tablet Take 1 tablet (20 mg total) by mouth every evening.  90 tablet  0  . tiotropium (SPIRIVA) 18 MCG inhalation capsule Place 18 mcg into inhaler and inhale daily.        . traMADol (ULTRAM) 50 MG tablet Take 50 mg by mouth every 8 (eight) hours as needed.        Marland Kitchen DISCONTD: amLODipine (NORVASC) 5 MG tablet Take 1 tablet (5 mg total) by mouth daily. PATIENT NEED OFFICE VISIT  15 tablet  0  . DISCONTD: simvastatin (ZOCOR) 20 MG tablet Take 1 tablet (20 mg total) by mouth every evening.  30 tablet  11  . DISCONTD: valsartan (DIOVAN) 160 MG tablet Take 1 tablet (160 mg total) by mouth daily.  30 tablet  6  . mometasone (NASONEX) 50 MCG/ACT nasal spray Place 2 sprays into the  nose daily.  17 g  2  . DISCONTD: buPROPion (BUPROBAN) 150 MG 12 hr tablet Take 150 mg by mouth 2 (two) times daily.

## 2011-08-11 NOTE — Assessment & Plan Note (Signed)
He has noticed increased cough recently.  He has post-nasal drip>>will try him on nasonex.  Will have him do chest xray and PFT also.

## 2011-08-11 NOTE — Patient Instructions (Signed)
Flu shot today Chest xray today>>will call with results Will schedule breathing test (PFT)>>will call with results Nasonex two sprays in each nostril until sample finished Follow up in 3 to 4 months

## 2011-08-11 NOTE — Assessment & Plan Note (Signed)
I sent a 90 script for amlodipine to [facility], and advised him to f/u with primary care and cardiology for further refills.

## 2011-08-11 NOTE — Assessment & Plan Note (Signed)
I sent a 90 script for simvastatin to [facility], and advised him to f/u with primary care and cardiology for further refills.

## 2011-08-11 NOTE — Assessment & Plan Note (Signed)
Continue spiriva.  Will determine if he needs adjustment to his inhaler regimen after review of his PFT and CXR.  He got his influenza vaccine today.

## 2011-08-14 ENCOUNTER — Telehealth: Payer: Self-pay | Admitting: Pulmonary Disease

## 2011-08-14 NOTE — Telephone Encounter (Signed)
CHEST - 2 VIEW 08/11/11: Comparison: 10/08/2009  Findings: Changes of underlying mild COPD are seen with a stable degree of mild hyperinflation. Taking this into consideration heart size is within normal limits and a stable mediastinal contour is seen.   The lung fields demonstrate stable central perihilar peribronchial cuffing and some diffuse mild increase in interstitial markings compatible with the prior history of smoking an underlying bronchitic change. No focal infiltrates or signs of congestive failure seen. No pleural fluid is noted.  Bony structures demonstrate mild diffuse degenerative osteophytosis of the thoracic spine. Screw plate fixation of the lower cervical spine is evident.   IMPRESSION:   Stable mild COPD changes with no new worrisome focal or acute process identified   Will have my nurse inform pt that chest xray shows expected changes from COPD, but no other findings.  No change to current treatment plan.

## 2011-08-14 NOTE — Telephone Encounter (Signed)
I spoke with patient about results and he verbalized understanding and had no questions 

## 2011-08-17 LAB — POCT I-STAT 3, ART BLOOD GAS (G3+)
Acid-Base Excess: 1
Bicarbonate: 24.8 — ABNORMAL HIGH
Operator id: 270211
Patient temperature: 98.6

## 2011-08-17 LAB — CBC
HCT: 40.5
Hemoglobin: 15
MCHC: 35.1
MCV: 83.8
MCV: 84.9
Platelets: 207
RBC: 5.11
RDW: 14.5
RDW: 14.5

## 2011-08-17 LAB — PROTIME-INR
INR: 1.1
Prothrombin Time: 14.6

## 2011-08-17 LAB — APTT: aPTT: 30

## 2011-08-17 LAB — TROPONIN I
Troponin I: 0.01
Troponin I: 0.01
Troponin I: 0.02

## 2011-08-17 LAB — CK TOTAL AND CKMB (NOT AT ARMC)
Relative Index: 2.2
Total CK: 185

## 2011-08-17 LAB — BASIC METABOLIC PANEL
BUN: 17
Creatinine, Ser: 1.08
GFR calc non Af Amer: >60
Glucose, Bld: 180 — ABNORMAL HIGH
Potassium: 3.5

## 2011-08-17 LAB — COMPREHENSIVE METABOLIC PANEL
Albumin: 3.4 — ABNORMAL LOW
Alkaline Phosphatase: 81
BUN: 10
CO2: 29
Chloride: 104
GFR calc non Af Amer: >60
Glucose, Bld: 99
Potassium: 3.7
Total Bilirubin: 1

## 2011-08-17 LAB — LIPID PANEL
Triglycerides: 86
VLDL: 17

## 2011-08-17 LAB — HEMOGLOBIN A1C
Hgb A1c MFr Bld: 5.9
Mean Plasma Glucose: 133

## 2011-08-17 LAB — DIFFERENTIAL
Basophils Absolute: 0
Basophils Relative: 0
Eosinophils Absolute: 0.1
Eosinophils Relative: 0
Monocytes Absolute: 1.1 — ABNORMAL HIGH
Monocytes Relative: 6

## 2011-08-17 LAB — TSH: TSH: 0.483

## 2011-08-17 LAB — B-NATRIURETIC PEPTIDE (CONVERTED LAB): Pro B Natriuretic peptide (BNP): 64

## 2011-08-22 LAB — HEMOGLOBIN AND HEMATOCRIT, BLOOD
HCT: 47.8
Hemoglobin: 16

## 2011-08-22 LAB — BASIC METABOLIC PANEL
BUN: 7
Creatinine, Ser: 0.8
GFR calc Af Amer: >60
GFR calc non Af Amer: >60
Potassium: 4.6

## 2011-08-24 ENCOUNTER — Telehealth: Payer: Self-pay | Admitting: Pulmonary Disease

## 2011-08-24 NOTE — Telephone Encounter (Signed)
Spoke with pt. He states that at last visit with VS, he had requested that VS write a letter for him for disability. He states that he has not heard anything since then, and is calling to check the status. VS, pls advise thanks!

## 2011-08-29 ENCOUNTER — Encounter: Payer: Self-pay | Admitting: Pulmonary Disease

## 2011-08-29 NOTE — Telephone Encounter (Signed)
Please inform patient that letter is completed in EP[employer].  Will sign letter later this week when I return to office.

## 2011-08-29 NOTE — Telephone Encounter (Signed)
Letter printed and placed in Dr. Evlyn Courier look at.  I called and spoke with pt.  I informed him the letter has been completed by Dr. Craige Cotta but he will not be back in the office until later this week to sign this.  Advised we will call him back once letter has been signed by Dr. Craige Cotta.  Pt would like to pick letter up and is ok with this.

## 2011-09-06 NOTE — Telephone Encounter (Signed)
Pt is aware letter is ready for p/u. Nothing further was needed

## 2013-11-10 ENCOUNTER — Ambulatory Visit: Payer: Self-pay | Admitting: Family Medicine

## 2013-11-11 ENCOUNTER — Ambulatory Visit: Payer: Self-pay | Admitting: Family Medicine

## 2013-11-17 ENCOUNTER — Encounter (INDEPENDENT_AMBULATORY_CARE_PROVIDER_SITE_OTHER): Payer: Self-pay

## 2013-11-17 ENCOUNTER — Ambulatory Visit (INDEPENDENT_AMBULATORY_CARE_PROVIDER_SITE_OTHER): Payer: Medicare Other | Admitting: Family Medicine

## 2013-11-17 ENCOUNTER — Encounter: Payer: Self-pay | Admitting: Family Medicine

## 2013-11-17 ENCOUNTER — Ambulatory Visit (INDEPENDENT_AMBULATORY_CARE_PROVIDER_SITE_OTHER): Payer: Medicare Other

## 2013-11-17 VITALS — BP 98/56 | HR 84 | Temp 99.2°F | Ht 65.0 in | Wt 184.0 lb

## 2013-11-17 DIAGNOSIS — L84 Corns and callosities: Secondary | ICD-10-CM

## 2013-11-17 DIAGNOSIS — M25579 Pain in unspecified ankle and joints of unspecified foot: Secondary | ICD-10-CM

## 2013-11-17 DIAGNOSIS — M25571 Pain in right ankle and joints of right foot: Secondary | ICD-10-CM

## 2013-11-17 MED ORDER — TRAMADOL-ACETAMINOPHEN 37.5-325 MG PO TABS
1.0000 | ORAL_TABLET | Freq: Four times a day (QID) | ORAL | Status: DC | PRN
Start: 2013-11-17 — End: 2022-06-19

## 2013-11-17 NOTE — Progress Notes (Signed)
New Patient History and Physical  Patient name: [name] Medical record number: [id number] Date of birth: 09-Jan-1946 Age: 67 y.o. Gender: male  Primary Care Provider: Rudi Heap, MD  Chief Complaint: ? Plantar splinter  History of Present Illness: Pt presents today with chief complaint pain on plantar aspect of right foot. Noticed it about 1 month ago and has been unable to remove it. Pain has worsened over the past week. No erythema or purulent drainage. Has been able to ambulate, albeit with pain. Unsure if there is splinter present or not. Denies any known trauma.     Past Medical History: Patient Active Problem List   Diagnosis Date Noted  . Cough 08/11/2011  . COPD 12/22/2009  . Essential hypertension, benign 11/22/2009  . CAD 11/22/2009  . HYPERLIPIDEMIA 10/08/2009   Past Medical History  Diagnosis Date  . Myocardial infarction   . Hyperlipidemia   . COPD (chronic obstructive pulmonary disease)   . GERD (gastroesophageal reflux disease)   . GI bleed     on Plavix and ECASA  . ACE-inhibitor cough   . Heart attack     Past Surgical History: Past Surgical History  Procedure Laterality Date  . Coronary stenting  2005  . Cervical spine surgery  2006    C6-C7  . Cataract extraction, bilateral    . Rotator cuff repair      left  . Plantar fascia surgery    . Hand tendon surgery      Social History: History   Social History  . Marital Status: Single    Spouse Name: N/A    Number of Children: N/A  . Years of Education: N/A   Social History Main Topics  . Smoking status: Former Smoker -- 3.00 packs/day for 50 years    Types: Cigarettes    Quit date: 12/21/2010  . Smokeless tobacco: None  . Alcohol Use: No  . Drug Use: No  . Sexual Activity: None   Other Topics Concern  . None   Social History Narrative  . None    Family History: Family History  Problem Relation Age of Onset  . Heart disease    . Heart attack Mother   . Dementia Father    . Heart disease Brother 50    Allergies: Allergies  Allergen Reactions  . Ace Inhibitors Cough    Current Outpatient Prescriptions  Medication Sig Dispense Refill  . albuterol (PROVENTIL HFA;VENTOLIN HFA) 108 (90 BASE) MCG/ACT inhaler Inhale 2 puffs into the lungs every 6 (six) hours as needed.        Marland Kitchen allopurinol (ZYLOPRIM) 100 MG tablet Take 100 mg by mouth daily.        Marland Kitchen aspirin 325 MG tablet Take 325 mg by mouth daily.        . flunisolide (NASAREL) 29 MCG/ACT (0.025%) nasal spray Place 2 sprays into the nose 2 (two) times daily. Dose is for each nostril.      . metFORMIN (GLUCOPHAGE) 500 MG tablet Take 500 mg by mouth daily with breakfast.      . simvastatin (ZOCOR) 20 MG tablet Take 1 tablet (20 mg total) by mouth every evening.  90 tablet  0  . amLODipine (NORVASC) 5 MG tablet Take 1 tablet (5 mg total) by mouth daily.  90 tablet  0   No current facility-administered medications for this visit.   Review Of Systems: 12 point ROS negative except as noted above in HPI.  Physical Exam: Filed Vitals:  11/17/13 1047  BP: 98/56  Pulse: 84  Temp: 99.2 F (37.3 C)    General: alert and cooperative HEENT: PERRLA and extra ocular movement intact Heart: S1, S2 normal, no murmur, rub or gallop, regular rate and rhythm Lungs: clear to auscultation, no wheezes or rales and unlabored breathing Abdomen: abdomen is soft without significant tenderness, masses, organomegaly or guarding Extremities: extremities normal, atraumatic, no cyanosis or edema and + mil dcallus formation on plantar aspect of R foot.  Skin:no ecchymoses, no petechiae Neurology: normal without focal findings  Labs and Imaging: [facility] reading (PRIMARY) by  Dr. Alvester Morin  R foot xray preliminarily negative for any foreign body over affected area.                                     Procedure: callus removal. Affected area cleansed in sterile fashion. Callus partially explored with # 11 blade with No visible  foreign body removed. Minimal bleeding. Overall procedure tolerated well. Overall risks and benefits discussed with pt prior to procedure.    No results found.   Assessment and Plan: Pain in joint, ankle and foot, right - Plan: DG Foot Complete Right  Callus - Plan: Ambulatory referral to Podiatry  No foreign body present on imaging preliminarily or with superficial exploration.  Will refer to podiatry for further assessment.     Doree Albee MD

## 2015-02-15 ENCOUNTER — Emergency Department (HOSPITAL_COMMUNITY): Payer: Medicare Other

## 2015-02-15 ENCOUNTER — Emergency Department (HOSPITAL_COMMUNITY)
Admission: EM | Admit: 2015-02-15 | Discharge: 2015-02-15 | Disposition: A | Discharge status: Home / Self Care | Payer: Medicare Other | Attending: Emergency Medicine | Admitting: Emergency Medicine

## 2015-02-15 ENCOUNTER — Encounter (HOSPITAL_COMMUNITY): Payer: Self-pay | Admitting: Emergency Medicine

## 2015-02-15 DIAGNOSIS — S41112A Laceration without foreign body of left upper arm, initial encounter: Secondary | ICD-10-CM | POA: Insufficient documentation

## 2015-02-15 DIAGNOSIS — I252 Old myocardial infarction: Secondary | ICD-10-CM | POA: Diagnosis not present

## 2015-02-15 DIAGNOSIS — Z7982 Long term (current) use of aspirin: Secondary | ICD-10-CM | POA: Insufficient documentation

## 2015-02-15 DIAGNOSIS — S62633A Displaced fracture of distal phalanx of left middle finger, initial encounter for closed fracture: Secondary | ICD-10-CM | POA: Insufficient documentation

## 2015-02-15 DIAGNOSIS — Y9289 Other specified places as the place of occurrence of the external cause: Secondary | ICD-10-CM | POA: Insufficient documentation

## 2015-02-15 DIAGNOSIS — Z7952 Long term (current) use of systemic steroids: Secondary | ICD-10-CM | POA: Diagnosis not present

## 2015-02-15 DIAGNOSIS — Z8709 Personal history of other diseases of the respiratory system: Secondary | ICD-10-CM | POA: Diagnosis not present

## 2015-02-15 DIAGNOSIS — Z87891 Personal history of nicotine dependence: Secondary | ICD-10-CM | POA: Diagnosis not present

## 2015-02-15 DIAGNOSIS — Y998 Other external cause status: Secondary | ICD-10-CM | POA: Diagnosis not present

## 2015-02-15 DIAGNOSIS — Y9389 Activity, other specified: Secondary | ICD-10-CM | POA: Diagnosis not present

## 2015-02-15 DIAGNOSIS — Z79899 Other long term (current) drug therapy: Secondary | ICD-10-CM | POA: Diagnosis not present

## 2015-02-15 DIAGNOSIS — J449 Chronic obstructive pulmonary disease, unspecified: Secondary | ICD-10-CM | POA: Diagnosis not present

## 2015-02-15 DIAGNOSIS — S62609A Fracture of unspecified phalanx of unspecified finger, initial encounter for closed fracture: Secondary | ICD-10-CM

## 2015-02-15 MED ORDER — CEPHALEXIN 500 MG PO CAPS: 500.0000 mg | ORAL_CAPSULE | Freq: Four times a day (QID) | ORAL | Status: DC

## 2015-02-15 MED ORDER — OXYCODONE-ACETAMINOPHEN 5-325 MG PO TABS: 1.0000 | ORAL_TABLET | ORAL | Status: DC | PRN

## 2015-02-15 MED ORDER — IBUPROFEN 400 MG PO TABS
600.0000 mg | ORAL_TABLET | Freq: Once | ORAL | Status: AC
  Administered 2015-02-15: 600 mg via ORAL
  Filled 2015-02-15: qty 2

## 2015-02-15 MED ORDER — OXYCODONE-ACETAMINOPHEN 5-325 MG PO TABS
2.0000 | ORAL_TABLET | Freq: Once | ORAL | Status: AC
  Administered 2015-02-15: 2 via ORAL
  Filled 2015-02-15: qty 2

## 2015-02-15 MED ORDER — BACITRACIN ZINC 500 UNIT/GM EX OINT
TOPICAL_OINTMENT | CUTANEOUS | Status: AC
  Administered 2015-02-15: 1
  Filled 2015-02-15: qty 0.9

## 2015-02-15 NOTE — Discharge Instructions (Signed)
Cast or Splint Care Casts and splints support injured limbs and keep bones from moving while they heal.  HOME CARE  Keep the cast or splint uncovered during the drying period.  A plaster cast can take 24 to 48 hours to dry.  A fiberglass cast will dry in less than 1 hour.  Do not rest the cast on anything harder than a pillow for 24 hours.  Do not put weight on your injured limb. Do not put pressure on the cast. Wait for your doctor's approval.  Keep the cast or splint dry.  Cover the cast or splint with a plastic bag during baths or wet weather.  If you have a cast over your chest and belly (trunk), take sponge baths until the cast is taken off.  If your cast gets wet, dry it with a towel or blow dryer. Use the cool setting on the blow dryer.  Keep your cast or splint clean. Wash a dirty cast with a damp cloth.  Do not put any objects under your cast or splint.  Do not scratch the skin under the cast with an object. If itching is a problem, use a blow dryer on a cool setting over the itchy area.  Do not trim or cut your cast.  Do not take out the padding from inside your cast.  Exercise your joints near the cast as told by your doctor.  Raise (elevate) your injured limb on 1 or 2 pillows for the first 1 to 3 days. GET HELP IF:  Your cast or splint cracks.  Your cast or splint is too tight or too loose.  You itch badly under the cast.  Your cast gets wet or has a soft spot.  You have a bad smell coming from the cast.  You get an object stuck under the cast.  Your skin around the cast becomes red or sore.  You have new or more pain after the cast is put on. GET HELP RIGHT AWAY IF:  You have fluid leaking through the cast.  You cannot move your fingers or toes.  Your fingers or toes turn blue or white or are cool, painful, or puffy (swollen).  You have tingling or lose feeling (numbness) around the injured area.  You have bad pain or pressure under the  cast.  You have trouble breathing or have shortness of breath.  You have chest pain. Document Released: 03/08/2011 Document Revised: 07/09/2013 Document Reviewed: 05/15/2013 [facility] Patient Information 2015 [place], [place]. This information is not intended to replace advice given to you by your health care provider. Make sure you discuss any questions you have with your health care provider.  Finger Fracture Fractures of fingers are breaks in the bones of the fingers. There are many types of fractures. There are different ways of treating these fractures. Your health care provider will discuss the best way to treat your fracture. CAUSES Traumatic injury is the main cause of broken fingers. These include:  Injuries while playing sports.  Workplace injuries.  Falls. RISK FACTORS Activities that can increase your risk of finger fractures include:  Sports.  Workplace activities that involve machinery.  A condition called osteoporosis, which can make your bones less dense and cause them to fracture more easily. SIGNS AND SYMPTOMS The main symptoms of a broken finger are pain and swelling within 15 minutes after the injury. Other symptoms include:  Bruising of your finger.  Stiffness of your finger.  Numbness of your finger.  Exposed  bones (compound fracture) if the fracture is severe. DIAGNOSIS  The best way to diagnose a broken bone is with X-ray imaging. Additionally, your health care provider will use this X-ray image to evaluate the position of the broken finger bones.  TREATMENT  Finger fractures can be treated with:   Nonreduction--This means the bones are in place. The finger is splinted without changing the positions of the bone pieces. The splint is usually left on for about a week to 10 days. This will depend on your fracture and what your health care provider thinks.  Closed reduction--The bones are put back into position without using surgery. The finger is then  splinted.  Open reduction and internal fixation--The fracture site is opened. Then the bone pieces are fixed into place with pins or some type of hardware. This is seldom required. It depends on the severity of the fracture. HOME CARE INSTRUCTIONS   Follow your health care provider's instructions regarding activities, exercises, and physical therapy.  Only take over-the-counter or prescription medicines for pain, discomfort, or fever as directed by your health care provider. SEEK MEDICAL CARE IF: You have pain or swelling that limits the motion or use of your fingers. SEEK IMMEDIATE MEDICAL CARE IF:  Your finger becomes numb. MAKE SURE YOU:   Understand these instructions.  Will watch your condition.  Will get help right away if you are not doing well or get worse. Document Released: 02/18/2001 Document Revised: 08/27/2013 Document Reviewed: 06/18/2013 [facility] Patient Information 2015 [place]. This information is not intended to replace advice given to you by your health care provider. Make sure you discuss any questions you have with your health care provider.  Laceration Care, Adult A laceration is a cut that goes through all layers of the skin. The cut goes into the tissue beneath the skin. HOME CARE For stitches (sutures) or staples:  Keep the cut clean and dry.  If you have a bandage (dressing), change it at least once a day. Change the bandage if it gets wet or dirty, or as told by your doctor.  Wash the cut with soap and water 2 times a day. Rinse the cut with water. Pat it dry with a clean towel.  Put a thin layer of medicated cream on the cut as told by your doctor.  You may shower after the first 24 hours. Do not soak the cut in water until the stitches are removed.  Only take medicines as told by your doctor.  Have your stitches or staples removed as told by your doctor. For skin adhesive strips:  Keep the cut clean and dry.  Do not get the strips wet.  You may take a bath, but be careful to keep the cut dry.  If the cut gets wet, pat it dry with a clean towel.  The strips will fall off on their own. Do not remove the strips that are still stuck to the cut. For wound glue:  You may shower or take baths. Do not soak or scrub the cut. Do not swim. Avoid heavy sweating until the glue falls off on its own. After a shower or bath, pat the cut dry with a clean towel.  Do not put medicine on your cut until the glue falls off.  If you have a bandage, do not put tape over the glue.  Avoid lots of sunlight or tanning lamps until the glue falls off. Put sunscreen on the cut for the first year to reduce your  scar.  The glue will fall off on its own. Do not pick at the glue. You may need a tetanus shot if:  You cannot remember when you had your last tetanus shot.  You have never had a tetanus shot. If you need a tetanus shot and you choose not to have one, you may get tetanus. Sickness from tetanus can be serious. GET HELP RIGHT AWAY IF:   Your pain does not get better with medicine.  Your arm, hand, leg, or foot loses feeling (numbness) or changes color.  Your cut is bleeding.  Your joint feels weak, or you cannot use your joint.  You have painful lumps on your body.  Your cut is red, puffy (swollen), or painful.  You have a red line on the skin near the cut.  You have yellowish-white fluid (pus) coming from the cut.  You have a fever.  You have a bad smell coming from the cut or bandage.  Your cut breaks open before or after stitches are removed.  You notice something coming out of the cut, such as wood or glass.  You cannot move a finger or toe. MAKE SURE YOU:   Understand these instructions.  Will watch your condition.  Will get help right away if you are not doing well or get worse. Document Released: 04/24/2008 Document Revised: 01/29/2012 Document Reviewed: 05/02/2011 [facility] Patient Information 2015 [place],  [place]. This information is not intended to replace advice given to you by your health care provider. Make sure you discuss any questions you have with your health care provider.
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
## 2015-02-15 NOTE — ED Notes (Signed)
Patient with no complaints at this time. Respirations even and unlabored. Skin warm/dry. Discharge instructions reviewed with patient at this time. Patient given opportunity to voice concerns/ask questions. Patient discharged at this time and left Emergency Department with steady gait.   

## 2015-02-15 NOTE — ED Notes (Signed)
Finger splint applied to left 3rd finger
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
## 2015-05-17 ENCOUNTER — Other Ambulatory Visit: Payer: Self-pay
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
## 2015-12-03 ENCOUNTER — Telehealth: Payer: Self-pay | Admitting: Family Medicine

## 2022-06-22 ENCOUNTER — Encounter (HOSPITAL_COMMUNITY): Payer: Self-pay | Admitting: Orthopedic Surgery

## 2022-06-22 NOTE — Progress Notes (Signed)
Anesthesia Chart Review: Same day workup  Follows with cardiology at the [facility] for history of CAD s/p MI in 08/2004 treated with DES x 2 to prox and distal LCX at [facility], moderate to severe aortic stenosis (mean gradient 37 mmHg by echo 09/2021), HTN.  Last seen 10/21/2021.  Per note, "1.Coronary artery disease, previous PCI x2 to the LCx: Generally stable, no further episodes of chest pain. [facility] medicine stress testing at [facility] showed mild peri-infarct reversibility. Patient continues to be asymptomatic, and quite active. Recommend continued medical management at this time. Recommend continued bisoprolol, losartan, aspirin, statin. 2. Aortic stenosis, moderate to severe: Stable, patient continues to be asymptomatic at this time. He was counseled on the importance of continued abstinence from tobacco products, and blood pressure management. Updated echocardiogram shows stable aortic stenosis, perhaps slight progression with previous mean gradient of 34, now 37 mmHg. He is to notify us if he notes increased lower extremity edema, shortness of breath, dizziness, syncope. --Continue serial echocardiograms for routine monitoring."  Patient did reach out to cardiology at the [facility] in April 2023 regarding upcoming knee arthroscopy.  Response from Lorelle Gibbs, PA-C on 03/07/2022 states, "Unfortunately, due to your aortic stenosis, or tightness of the aortic valve,  we usually recommend you have surgery at a larger facility. The concern is that folks with significant aortic stenosis can sometimes have very low blood pressure w/ anesthesia administration, and this is better and more safely managed at larger facilities that have more resources available should your blood pressure get too low. In some cases of larger procedures, we sometimes recommend having your valve replace prior to other surgeries, but w/ a knee scope being a relatively small procedure, this can usually be managed without having to  have the valve replaced first."  History of COPD, maintained on fluticasone-albuterol.  Non-insulin-dependent DM2, maintained on metformin.  Hx of C6-7 ACDF.  Reviewed cardiac history with anesthesiologist Dr. Darlin Drop.  She advised patient okay to proceed as planned.  Will need day of surgery evaluation by assigned anesthesiologist.  Will need DOS labs.   [facility]21/[facility] NSR. Rate 68.  Transthoracic echocardiogram 09/22/2021 There is mild concentric left ventricular hypertrophy. Ejection Fraction = >55% (Visual Estimation). The left ventricular wall motion is normal. Grade I diastolic dysfunction, (abnormal relaxation pattern). The E/e'ratio is 10. The right ventricle is normal in size and function. The left atrium is borderline dilated. Severe valvular aortic stenosis. The calculated aortic valve area using the continuity equation is 0.8 cm2. The peak aortic valve velocity 390 cm/s Aortic mean pressure gradient= 37 mmHg. Aortic max pressure gradient= 60 mmHg Mild to moderate aortic regurgitation. The aortic valve pressure half time is 475 msec. Right ventricular systolic pressure is normal. The aortic root is normal size. There is no pericardial effusion.  [facility] Nuclear medicine stress test 11/08/2017 Impression 1. Large fixed defect is seen involving the inferior myocardium consistent with  old infarction. Mild reversibility is seen involving the inferior apical  myocardium suggesting ischemia. 2. Mild dyskinesis of the inferior septal myocardium is noted. 3. Left ventricular ejection fraction 51% 4. Noninvasive risk stratification: Intermediate    [place] [facility] Short Stay Center/Anesthesiology Phone [phone] 06/22/2022 2:44 PM

## 2022-06-22 NOTE — Progress Notes (Signed)
[facility] - [facility]  Cardiologist - Dr. Gearldine Shown [facility] EKG - DOS Chest x-ray -  ECHO -  Cardiac Cath -  CPAP -  Fasting Blood Sugar:  pt does not check CBG at home, gets checked every 6 months at [facility]  Checks Blood Sugar:  /day Blood Thinner Instructions:  Aspirin Instructions: ASA last dose 7/27 ERAS Protcol - yes 0945 COVID TEST- n/a  Anesthesia review: yes  -------------  SDW INSTRUCTIONS:  Your procedure is scheduled on 8/4. Please report to [facility] Main Entrance "A" at 1015 A.M., and check in at the Admitting office. Call this number if you have problems the morning of surgery: [phone]   Remember: Do not eat after midnight the night before your surgery  You may drink clear liquids until 0945 AM the morning of your surgery.   Clear liquids allowed are: Water, Non-Citrus Juices (without pulp), Carbonated Beverages, Clear Tea, Black Coffee Only, and Gatorade   Medications to take morning of surgery with a sip of water include: allopurinol (ZYLOPRIM)  amLODipine (NORVASC)  bisoprolol (ZEBETA) rosuvastatin (CRESTOR)   As of today, STOP taking any Aspirin (unless otherwise instructed by your surgeon), Aleve, Naproxen, Ibuprofen, Motrin, Advil, Goody's, BC's, all herbal medications, fish oil, and all vitamins.    The Morning of Surgery Do not wear jewelry Do not wear lotions, powders, colognes, or deodorant Do not bring valuables to the hospital. [facility] is not responsible for any belongings or valuables.  If you are a smoker, DO NOT Smoke 24 hours prior to surgery  If you wear a CPAP at night please bring your mask the morning of surgery   Remember that you must have someone to transport you home after your surgery, and remain with you for 24 hours if you are discharged the same day.  Please bring cases for contacts, glasses, hearing aids, dentures or bridgework because it cannot be worn into surgery.   Patients discharged the  day of surgery will not be allowed to drive home.   Please shower the NIGHT BEFORE/MORNING OF SURGERY (use antibacterial soap like DIAL soap if possible). Wear comfortable clothes the morning of surgery. Oral Hygiene is also important to reduce your risk of infection.  Remember - BRUSH YOUR TEETH THE MORNING OF SURGERY WITH YOUR REGULAR TOOTHPASTE  Patient denies shortness of breath, fever, cough and chest pain.

## 2022-06-22 NOTE — Anesthesia Preprocedure Evaluation (Addendum)
Anesthesia Evaluation  Patient identified by MRN, date of birth, ID band Patient awake    Reviewed: Allergy & Precautions, NPO status , Patient's Chart, lab work & pertinent test results  Airway Mallampati: II  TM Distance: >3 FB Neck ROM: Full    Dental no notable dental hx. (+) Missing, Dental Advisory Given   Pulmonary COPD,  COPD inhaler, former smoker,    Pulmonary exam normal breath sounds clear to auscultation       Cardiovascular hypertension, + CAD, + Past MI (2005) and + Cardiac Stents  Normal cardiovascular exam+ Valvular Problems/Murmurs (Mos to severe AS) AS and AI  Rhythm:Regular Rate:Normal  EF 55%    Neuro/Psych negative neurological ROS     GI/Hepatic GERD  ,  Endo/Other  diabetes, Type 2  Renal/GU      Musculoskeletal S/P ACDF C6-7   Abdominal   Peds  Hematology   Anesthesia Other Findings All: Lisinopril, Ace inhibitors  Reproductive/Obstetrics                           Anesthesia Physical Anesthesia Plan  ASA: 3  Anesthesia Plan: General   Post-op Pain Management: Minimal or no pain anticipated   Induction: Intravenous  PONV Risk Score and Plan: 3 and Treatment may vary due to age or medical condition and Ondansetron  Airway Management Planned: LMA  Additional Equipment: None  Intra-op Plan:   Post-operative Plan:   Informed Consent: I have reviewed the patients History and Physical, chart, labs and discussed the procedure including the risks, benefits and alternatives for the proposed anesthesia with the patient or authorized representative who has indicated his/her understanding and acceptance.     Dental advisory given  Plan Discussed with:   Anesthesia Plan Comments: (PAT note by Antionette Poles, PA-C: Follows with cardiology at the [facility] for history of CAD s/p MI in 08/2004 treated with DES x 2 to prox and distal LCX at [facility], moderate to  severe aortic stenosis (mean gradient 37 mmHg by echo 09/2021), HTN.  Last seen 10/21/2021.  Per note, "1.Coronary artery disease, previous PCI x2 to the LCx: Generally stable, no further episodes of chest pain. [facility] showed mild peri-infarct reversibility. Patient continues to be asymptomatic, and quite active. Recommend continued medical management at this time. Recommend continued bisoprolol, losartan, aspirin, statin. 2. Aortic stenosis, moderate to severe: Stable, patient continues to be asymptomatic at this time. He was counseled on the importance of continued abstinence from tobacco products, and blood pressure management. Updated echocardiogram shows stable aortic stenosis, perhaps slight progression with previous mean gradient of 34, now 37 mmHg. He is to notify us if he notes increased lower extremity edema, shortness of breath, dizziness, syncope. --Continue serial echocardiograms for routine monitoring."  Patient did reach out to cardiology at the [facility] in April 2023 regarding upcoming knee arthroscopy.  Response from Lorelle Gibbs, PA-C on 03/07/2022 states, "Unfortunately, due to your aortic stenosis, or tightness of the aortic valve,  we usually recommend you have surgery at a larger facility. The concern is that folks with significant aortic stenosis can sometimes have very low blood pressure w/ anesthesia administration, and this is better and more safely managed at larger facilities that have more resources available should your blood pressure get too low. In some cases of larger procedures, we sometimes recommend having your valve replace prior to other surgeries, but w/ a knee scope being a relatively  small procedure, this can usually be managed without having to have the valve replaced first."  History of COPD, maintained on fluticasone-albuterol.  Non-insulin-dependent DM2, maintained on metformin.  Hx of C6-7 ACDF.  Reviewed cardiac history with  anesthesiologist Dr. Darlin Drop.  She advised patient okay to proceed as planned.  Will need day of surgery evaluation by assigned anesthesiologist.  Will need DOS labs.   [facility] NSR. Rate 68.  Transthoracic echocardiogram 09/22/2021 There is mild concentric left ventricular hypertrophy. Ejection Fraction = >55% (Visual Estimation). The left ventricular wall motion is normal. Grade I diastolic dysfunction, (abnormal relaxation pattern). The E/e'ratio is 10. The right ventricle is normal in size and function. The left atrium is borderline dilated. Severe valvular aortic stenosis. The calculated aortic valve area using the continuity equation is 0.8 cm2. The peak aortic valve velocity 390 cm/s Aortic mean pressure gradient= 37 mmHg. Aortic max pressure gradient= 60 mmHg Mild to moderate aortic regurgitation. The aortic valve pressure half time is 475 msec. Right ventricular systolic pressure is normal. The aortic root is normal size. There is no pericardial effusion.  [facility] Nuclear medicine stress test 11/08/2017 Impression 1. Large fixed defect is seen involving the inferior myocardium consistent with  old infarction. Mild reversibility is seen involving the inferior apical  myocardium suggesting ischemia. 2. Mild dyskinesis of the inferior septal myocardium is noted. 3. Left ventricular ejection fraction 51% 4. Noninvasive risk stratification: Intermediate   )      Anesthesia Quick Evaluation

## 2022-06-23 ENCOUNTER — Other Ambulatory Visit: Payer: Self-pay

## 2022-06-23 ENCOUNTER — Encounter (HOSPITAL_COMMUNITY)
Admission: RE | Disposition: A | Discharge status: Home / Self Care | Payer: Self-pay | Source: Ambulatory Visit | Attending: Orthopedic Surgery

## 2022-06-23 ENCOUNTER — Ambulatory Visit (HOSPITAL_COMMUNITY)
Admission: RE | Admit: 2022-06-23 | Discharge: 2022-06-23 | Disposition: A | Discharge status: Home / Self Care | Payer: No Typology Code available for payment source | Source: Ambulatory Visit | Attending: Orthopedic Surgery | Admitting: Orthopedic Surgery

## 2022-06-23 ENCOUNTER — Ambulatory Visit (HOSPITAL_COMMUNITY): Payer: No Typology Code available for payment source | Admitting: Physician Assistant

## 2022-06-23 ENCOUNTER — Encounter (HOSPITAL_COMMUNITY): Payer: Self-pay | Admitting: Orthopedic Surgery

## 2022-06-23 ENCOUNTER — Ambulatory Visit (HOSPITAL_BASED_OUTPATIENT_CLINIC_OR_DEPARTMENT_OTHER): Payer: No Typology Code available for payment source | Admitting: Physician Assistant

## 2022-06-23 DIAGNOSIS — I110 Hypertensive heart disease with heart failure: Secondary | ICD-10-CM | POA: Insufficient documentation

## 2022-06-23 DIAGNOSIS — Z955 Presence of coronary angioplasty implant and graft: Secondary | ICD-10-CM | POA: Diagnosis not present

## 2022-06-23 DIAGNOSIS — I2510 Atherosclerotic heart disease of native coronary artery without angina pectoris: Secondary | ICD-10-CM | POA: Insufficient documentation

## 2022-06-23 DIAGNOSIS — S83232A Complex tear of medial meniscus, current injury, left knee, initial encounter: Secondary | ICD-10-CM

## 2022-06-23 DIAGNOSIS — Z87891 Personal history of nicotine dependence: Secondary | ICD-10-CM | POA: Insufficient documentation

## 2022-06-23 DIAGNOSIS — X58XXXA Exposure to other specified factors, initial encounter: Secondary | ICD-10-CM | POA: Diagnosis not present

## 2022-06-23 DIAGNOSIS — Z7984 Long term (current) use of oral hypoglycemic drugs: Secondary | ICD-10-CM | POA: Insufficient documentation

## 2022-06-23 DIAGNOSIS — J449 Chronic obstructive pulmonary disease, unspecified: Secondary | ICD-10-CM | POA: Diagnosis not present

## 2022-06-23 DIAGNOSIS — M659 Synovitis and tenosynovitis, unspecified: Secondary | ICD-10-CM

## 2022-06-23 DIAGNOSIS — I252 Old myocardial infarction: Secondary | ICD-10-CM

## 2022-06-23 DIAGNOSIS — I509 Heart failure, unspecified: Secondary | ICD-10-CM | POA: Insufficient documentation

## 2022-06-23 DIAGNOSIS — M65862 Other synovitis and tenosynovitis, left lower leg: Secondary | ICD-10-CM | POA: Diagnosis not present

## 2022-06-23 DIAGNOSIS — E119 Type 2 diabetes mellitus without complications: Secondary | ICD-10-CM | POA: Insufficient documentation

## 2022-06-23 DIAGNOSIS — K219 Gastro-esophageal reflux disease without esophagitis: Secondary | ICD-10-CM | POA: Diagnosis not present

## 2022-06-23 DIAGNOSIS — Z6830 Body mass index (BMI) 30.0-30.9, adult: Secondary | ICD-10-CM | POA: Diagnosis not present

## 2022-06-23 HISTORY — PX: KNEE ARTHROSCOPY WITH MEDIAL MENISECTOMY: SHX5651

## 2022-06-23 HISTORY — DX: Type 2 diabetes mellitus without complications: E11.9

## 2022-06-23 LAB — BASIC METABOLIC PANEL
Anion gap: 9 (ref 5–15)
BUN: 10 mg/dL (ref 8–23)
CO2: 25 mmol/L (ref 22–32)
Calcium: 9.1 mg/dL (ref 8.9–10.3)
Chloride: 106 mmol/L (ref 98–111)
Creatinine, Ser: 1.13 mg/dL (ref 0.61–1.24)
GFR, Estimated: >60 mL/min (ref >60)
Glucose, Bld: 116 mg/dL — ABNORMAL HIGH (ref 70–99)
Potassium: 4.4 mmol/L (ref 3.5–5.1)
Sodium: 140 mmol/L (ref 135–145)

## 2022-06-23 LAB — CBC
HCT: 41.3 % (ref 39.0–52.0)
Hemoglobin: 13.6 g/dL (ref 13.0–17.0)
MCH: 27.6 pg (ref 26.0–34.0)
MCHC: 32.9 g/dL (ref 30.0–36.0)
MCV: 83.9 fL (ref 80.0–100.0)
Platelets: 240 10*3/uL (ref 150–400)
RBC: 4.92 MIL/uL (ref 4.22–5.81)
RDW: 15 % (ref 11.5–15.5)
WBC: 10.2 10*3/uL (ref 4.0–10.5)
nRBC: 0 % (ref 0.0–0.2)

## 2022-06-23 LAB — GLUCOSE, CAPILLARY
Glucose-Capillary: 139 mg/dL — ABNORMAL HIGH (ref 70–99)
Glucose-Capillary: 126 mg/dL — ABNORMAL HIGH (ref 70–99)

## 2022-06-23 SURGERY — ARTHROSCOPY, KNEE, WITH MEDIAL MENISCECTOMY
Anesthesia: General | Site: Knee | Laterality: Left

## 2022-06-23 MED ORDER — FENTANYL CITRATE (PF) 250 MCG/5ML IJ SOLN
INTRAMUSCULAR | Status: AC
  Filled 2022-06-23: qty 5

## 2022-06-23 MED ORDER — ALBUTEROL SULFATE (2.5 MG/3ML) 0.083% IN NEBU
INHALATION_SOLUTION | RESPIRATORY_TRACT | Status: AC
  Filled 2022-06-23: qty 3

## 2022-06-23 MED ORDER — FENTANYL CITRATE (PF) 100 MCG/2ML IJ SOLN
INTRAMUSCULAR | Status: DC | PRN
  Administered 2022-06-23: 25 ug via INTRAVENOUS
  Administered 2022-06-23: 50 ug via INTRAVENOUS

## 2022-06-23 MED ORDER — ONDANSETRON HCL 4 MG/2ML IJ SOLN: 4.0000 mg | Freq: Once | INTRAMUSCULAR | Status: DC | PRN

## 2022-06-23 MED ORDER — MIDAZOLAM HCL 5 MG/5ML IJ SOLN
INTRAMUSCULAR | Status: DC | PRN
  Administered 2022-06-23: 1 mg via INTRAVENOUS

## 2022-06-23 MED ORDER — ONDANSETRON HCL 4 MG/2ML IJ SOLN
INTRAMUSCULAR | Status: AC
  Filled 2022-06-23: qty 2

## 2022-06-23 MED ORDER — DEXAMETHASONE SODIUM PHOSPHATE 10 MG/ML IJ SOLN
INTRAMUSCULAR | Status: AC
  Filled 2022-06-23: qty 1

## 2022-06-23 MED ORDER — PROPOFOL 10 MG/ML IV BOLUS
INTRAVENOUS | Status: DC | PRN
  Administered 2022-06-23: 110 mg via INTRAVENOUS

## 2022-06-23 MED ORDER — LIDOCAINE HCL (CARDIAC) PF 100 MG/5ML IV SOSY
PREFILLED_SYRINGE | INTRAVENOUS | Status: DC | PRN
  Administered 2022-06-23: 80 mg via INTRAVENOUS

## 2022-06-23 MED ORDER — PHENYLEPHRINE 80 MCG/ML (10ML) SYRINGE FOR IV PUSH (FOR BLOOD PRESSURE SUPPORT)
PREFILLED_SYRINGE | INTRAVENOUS | Status: DC | PRN
  Administered 2022-06-23 (×2): 80 ug via INTRAVENOUS

## 2022-06-23 MED ORDER — INSULIN ASPART 100 UNIT/ML IJ SOLN: 0.0000 [IU] | INTRAMUSCULAR | Status: DC | PRN

## 2022-06-23 MED ORDER — ONDANSETRON HCL 4 MG PO TABS: 4.0000 mg | ORAL_TABLET | Freq: Three times a day (TID) | ORAL | 1 refills | Status: AC | PRN

## 2022-06-23 MED ORDER — ALBUTEROL SULFATE (2.5 MG/3ML) 0.083% IN NEBU
2.5000 mg | INHALATION_SOLUTION | Freq: Four times a day (QID) | RESPIRATORY_TRACT | Status: DC | PRN
  Administered 2022-06-23: 2.5 mg via RESPIRATORY_TRACT

## 2022-06-23 MED ORDER — ONDANSETRON HCL 4 MG/2ML IJ SOLN
INTRAMUSCULAR | Status: DC | PRN
  Administered 2022-06-23: 4 mg via INTRAVENOUS

## 2022-06-23 MED ORDER — LIDOCAINE 2% (20 MG/ML) 5 ML SYRINGE
INTRAMUSCULAR | Status: AC
  Filled 2022-06-23: qty 5

## 2022-06-23 MED ORDER — PHENYLEPHRINE HCL-NACL 20-0.9 MG/250ML-% IV SOLN
INTRAVENOUS | Status: DC | PRN
  Administered 2022-06-23: 25 ug/min via INTRAVENOUS

## 2022-06-23 MED ORDER — FENTANYL CITRATE (PF) 100 MCG/2ML IJ SOLN: 25.0000 ug | INTRAMUSCULAR | Status: DC | PRN

## 2022-06-23 MED ORDER — BUPIVACAINE-EPINEPHRINE (PF) 0.25% -1:200000 IJ SOLN
INTRAMUSCULAR | Status: AC
  Filled 2022-06-23: qty 30

## 2022-06-23 MED ORDER — HYDROCODONE-ACETAMINOPHEN 7.5-325 MG PO TABS: 1.0000 | ORAL_TABLET | Freq: Four times a day (QID) | ORAL | 0 refills | Status: DC | PRN

## 2022-06-23 MED ORDER — PROPOFOL 10 MG/ML IV BOLUS
INTRAVENOUS | Status: AC
  Filled 2022-06-23: qty 20

## 2022-06-23 MED ORDER — ORAL CARE MOUTH RINSE: 15.0000 mL | Freq: Once | OROMUCOSAL | Status: AC

## 2022-06-23 MED ORDER — ACETAMINOPHEN 10 MG/ML IV SOLN: 1000.0000 mg | Freq: Once | INTRAVENOUS | Status: DC | PRN

## 2022-06-23 MED ORDER — DEXAMETHASONE SODIUM PHOSPHATE 4 MG/ML IJ SOLN
INTRAMUSCULAR | Status: DC | PRN
  Administered 2022-06-23: 4 mg via INTRAVENOUS

## 2022-06-23 MED ORDER — CEFAZOLIN SODIUM-DEXTROSE 2-4 GM/100ML-% IV SOLN
2.0000 g | INTRAVENOUS | Status: AC
  Administered 2022-06-23: 2 g via INTRAVENOUS
  Filled 2022-06-23: qty 100

## 2022-06-23 MED ORDER — MIDAZOLAM HCL 2 MG/2ML IJ SOLN
INTRAMUSCULAR | Status: AC
Start: 2022-06-23 — End: ?
  Filled 2022-06-23: qty 2

## 2022-06-23 MED ORDER — BUPIVACAINE-EPINEPHRINE 0.25% -1:200000 IJ SOLN
INTRAMUSCULAR | Status: DC | PRN
  Administered 2022-06-23: 20 mL

## 2022-06-23 MED ORDER — LACTATED RINGERS IV SOLN: INTRAVENOUS | Status: DC

## 2022-06-23 MED ORDER — CHLORHEXIDINE GLUCONATE 0.12 % MT SOLN
15.0000 mL | Freq: Once | OROMUCOSAL | Status: AC
  Administered 2022-06-23: 15 mL via OROMUCOSAL
  Filled 2022-06-23: qty 15

## 2022-06-23 SURGICAL SUPPLY — 32 items
BAG COUNTER SPONGE SURGICOUNT (BAG) ×3 IMPLANT
BAG SPNG CNTER NS LX DISP (BAG) ×1
BNDG CMPR MED 10X6 ELC LF (GAUZE/BANDAGES/DRESSINGS) ×1
BNDG ELASTIC 6X10 VLCR STRL LF (GAUZE/BANDAGES/DRESSINGS) ×1 IMPLANT
BNDG ELASTIC 6X5.8 VLCR STR LF (GAUZE/BANDAGES/DRESSINGS) ×3 IMPLANT
CLSR STERI-STRIP ANTIMIC 1/2X4 (GAUZE/BANDAGES/DRESSINGS) ×1 IMPLANT
DRAPE ARTHROSCOPY W/POUCH 114 (DRAPES) ×3 IMPLANT
DRAPE U-SHAPE 47X51 STRL (DRAPES) ×3 IMPLANT
DRSG PAD ABDOMINAL 8X10 ST (GAUZE/BANDAGES/DRESSINGS) ×3 IMPLANT
DRSG XEROFORM 1X8 (GAUZE/BANDAGES/DRESSINGS) ×3 IMPLANT
DURAPREP 26ML APPLICATOR (WOUND CARE) ×3 IMPLANT
GAUZE 4X4 16PLY ~~LOC~~+RFID DBL (SPONGE) ×3 IMPLANT
GAUZE SPONGE 4X4 12PLY STRL (GAUZE/BANDAGES/DRESSINGS) ×3 IMPLANT
GAUZE XEROFORM 1X8 LF (GAUZE/BANDAGES/DRESSINGS) ×3 IMPLANT
GLOVE BIO SURGEON STRL SZ7.5 (GLOVE) ×6 IMPLANT
GLOVE BIOGEL PI IND STRL 8 (GLOVE) ×4 IMPLANT
GLOVE BIOGEL PI INDICATOR 8 (GLOVE) ×2
GOWN STRL REUS W/ TWL LRG LVL3 (GOWN DISPOSABLE) ×2 IMPLANT
GOWN STRL REUS W/ TWL XL LVL3 (GOWN DISPOSABLE) ×4 IMPLANT
GOWN STRL REUS W/TWL LRG LVL3 (GOWN DISPOSABLE) ×2
GOWN STRL REUS W/TWL XL LVL3 (GOWN DISPOSABLE) ×4
KIT BASIN OR (CUSTOM PROCEDURE TRAY) ×3 IMPLANT
MANIFOLD NEPTUNE II (INSTRUMENTS) ×3 IMPLANT
NDL PRECISIONGLIDE 27X1.5 (NEEDLE) ×2 IMPLANT
NEEDLE PRECISIONGLIDE 27X1.5 (NEEDLE) ×2 IMPLANT
PACK ARTHROSCOPY DSU (CUSTOM PROCEDURE TRAY) ×3 IMPLANT
PADDING CAST COTTON 6X4 STRL (CAST SUPPLIES) ×3 IMPLANT
SUT ETHILON 4 0 PS 2 18 (SUTURE) ×3 IMPLANT
SYR CONTROL 10ML LL (SYRINGE) ×3 IMPLANT
TOWEL GREEN STERILE (TOWEL DISPOSABLE) ×3 IMPLANT
TUBING ARTHROSCOPY IRRIG 16FT (MISCELLANEOUS) ×3 IMPLANT
WRAP KNEE MAXI GEL POST OP (GAUZE/BANDAGES/DRESSINGS) ×3 IMPLANT

## 2022-06-23 NOTE — Op Note (Signed)
Surgery Date: 06/23/2022  Surgeon(s): Yolonda Kida, MD  Assistant:  Youlanda Roys, RNFA  Assistant attestation:  Assistant was present for the entire procedure.  ANESTHESIA:  general, local  FLUIDS: Per anesthesia record.   ESTIMATED BLOOD LOSS: minimal  PREOPERATIVE DIAGNOSES:  1.  Left knee acute complex medial meniscus tear 2.  Left knee synovitis  POSTOPERATIVE DIAGNOSES:  same  PROCEDURES PERFORMED:  1.  Left knee arthroscopy with arthroscopic partial medial meniscectomy 2. Left knee arthroscopy with arthroscopic chondroplasty medial femoral condyle and medial tibial plateau.   DESCRIPTION OF PROCEDURE: [name] is a 76 y.o.-year-old male with left knee medial meniscus tear. Plans are to proceed with partial medial meniscectomy and diagnostic arthroscopy with debridement as indicated. Full discussion held regarding risks benefits alternatives and complications related surgical intervention. Conservative care options reviewed. All questions answered.  The patient was identified in the preoperative holding area and the operative extremity was marked. The patient was brought to the operating room and transferred to operating table in a supine position. Satisfactory general anesthesia was induced by anesthesiology.    Standard anterolateral, anteromedial arthroscopy portals were obtained. The anteromedial portal was obtained with a spinal needle for localization under direct visualization with subsequent diagnostic findings.   Anteromedial and anterolateral chambers: mild synovitis. The synovitis was debrided with a 4.5 mm full radius shaver through both the anteromedial and lateral portals.   Suprapatellar pouch and gutters: mild synovitis or debris. Patella chondral surface: Grade 1 Trochlear chondral surface: Grade 1 Patellofemoral tracking: Midline level Medial meniscus: Horizontal tear of the mid body and posterior horn with the inferior flap fragment that was  flipped into the medial gutter.  Medial femoral condyle flexion bearing surface: Grade 2 Medial femoral condyle extension bearing surface: Grade 1 Medial tibial plateau: Grade 1 Anterior cruciate ligament:stable Posterior cruciate ligament:stable Lateral meniscus: Intact without tearing.   Lateral femoral condyle flexion bearing surface: Grade 0 Lateral femoral condyle extension bearing surface: Grade 0 Lateral tibial plateau: Grade 0  Partial medial meniscectomy was carried out with combination of meniscal biter as well as arthroscopic shaver.  We resected the inferior leaflet.  Total resected surface area was approximately 15%.  After completion of synovectomy, diagnostic exam, and debridements as described, all compartments were checked and no residual debris remained. Hemostasis was achieved with the cautery wand. The portals were approximated with buried monocryl. All excess fluid was expressed from the joint.  Xeroform sterile gauze dressings were applied followed by Ace bandage and ice pack.   DISPOSITION: The patient was awakened from general anesthetic, extubated, taken to the recovery room in medically stable condition, no apparent complications. The patient may be weightbearing as tolerated to the operative lower extremity.  Range of motion of right knee as tolerated.

## 2022-06-23 NOTE — Anesthesia Postprocedure Evaluation (Signed)
Anesthesia Post Note  Patient: [name].  Procedure(s) Performed: KNEE ARTHROSCOPY WITH PARTIAL MEDIAL MENISECTOMY (Left: Knee)     Patient location during evaluation: PACU Anesthesia Type: General Level of consciousness: awake and alert Pain management: pain level controlled Vital Signs Assessment: post-procedure vital signs reviewed and stable Respiratory status: spontaneous breathing, nonlabored ventilation, respiratory function stable and patient connected to nasal cannula oxygen Cardiovascular status: blood pressure returned to baseline and stable Postop Assessment: no apparent nausea or vomiting Anesthetic complications: no   No notable events documented.  Last Vitals:  Vitals:   06/23/22 1400 06/23/22 1415  BP: 136/68 128/63  Pulse: 72 72  Resp: 12 13  Temp:  36.6 C  SpO2: 98% 94%    Last Pain:  Vitals:   06/23/22 1415  TempSrc:   PainSc: 0-No pain                 Trevor Iha

## 2022-06-23 NOTE — Transfer of Care (Signed)
Immediate Anesthesia Transfer of Care Note  Patient: [name].  Procedure(s) Performed: KNEE ARTHROSCOPY WITH PARTIAL MEDIAL MENISECTOMY (Left: Knee)  Patient Location: PACU  Anesthesia Type:General  Level of Consciousness: awake, alert , patient cooperative and responds to stimulation  Airway & Oxygen Therapy: Patient Spontanous Breathing and Patient connected to nasal cannula oxygen  Post-op Assessment: Report given to RN and Post -op Vital signs reviewed and stable  Post vital signs: Reviewed and stable  Last Vitals:  Vitals Value Taken Time  BP 123/67 06/23/22 1345  Temp    Pulse 78 06/23/22 1345  Resp 15 06/23/22 1345  SpO2 92 % 06/23/22 1345  Vitals shown include unvalidated device data.  Last Pain:  Vitals:   06/23/22 1049  TempSrc:   PainSc: 0-No pain         Complications: No notable events documented.

## 2022-06-23 NOTE — Brief Op Note (Signed)
06/23/2022  1:25 PM  PATIENT:  [name].  76 y.o. male  PRE-OPERATIVE DIAGNOSIS:  Left knee medial meniscal tear  POST-OPERATIVE DIAGNOSIS:  Left knee medial meniscal tear  PROCEDURE:  Procedure(s) with comments: KNEE ARTHROSCOPY WITH PARTIAL MEDIAL MENISECTOMY (Left) - 60  SURGEON:  Surgeon(s) and Role:    * Aundria Rud, Noah Delaine, MD - Primary  ASSISTANTS: Youlanda Roys, RNFA   ANESTHESIA:   local and general  EBL: 5 cc  BLOOD ADMINISTERED:none  DRAINS: none   LOCAL MEDICATIONS USED:  MARCAINE     SPECIMEN:  No Specimen  DISPOSITION OF SPECIMEN:  N/A  COUNTS:  YES  TOURNIQUET:  * No tourniquets in log *  DICTATION: .Note written in EPIC  PLAN OF CARE: Discharge to home after PACU  PATIENT DISPOSITION:  PACU - hemodynamically stable.   Delay start of Pharmacological VTE agent (>24hrs) due to surgical blood loss or risk of bleeding: not applicable

## 2022-06-23 NOTE — Discharge Instructions (Addendum)

## 2022-06-23 NOTE — H&P (Signed)
ORTHOPAEDIC H&P  REQUESTING PHYSICIAN: Yolonda Kida, MD  PCP:  [facility]  Chief Complaint: Left knee pain  HPI: [name]. is a 76 y.o. male who complains of left knee mechanical symptoms including pain and swelling.  Here today for arthroscopic partial medial meniscectomy of the left knee.  Previously we have discussed that in detail.  No new complaints today.  Past Medical History:  Diagnosis Date   ACE-inhibitor cough    COPD (chronic obstructive pulmonary disease) (HCC)    Diabetes mellitus without complication (HCC)    GERD (gastroesophageal reflux disease)    GI bleed    on Plavix and ECASA   Heart attack (HCC)    Hyperlipidemia    Myocardial infarction [facility])    Past Surgical History:  Procedure Laterality Date   CATARACT EXTRACTION, BILATERAL     CERVICAL SPINE SURGERY  2006   C6-C7   Coronary stenting  2005   HAND TENDON SURGERY     PLANTAR FASCIA SURGERY     ROTATOR CUFF REPAIR     left   Social History   Socioeconomic History   Marital status: Single    Spouse name: Not on file   Number of children: Not on file   Years of education: Not on file   Highest education level: Not on file  Occupational History   Not on file  Tobacco Use   Smoking status: Former    Packs/day: 3.00    Years: 50.00    Total pack years: 150.00    Types: Cigarettes    Quit date: 12/21/2010    Years since quitting: 11.5   Smokeless tobacco: Not on file  Vaping Use   Vaping Use: Never used  Substance and Sexual Activity   Alcohol use: No   Drug use: No   Sexual activity: Not on file  Other Topics Concern   Not on file  Social History Narrative   Not on file   Social Determinants of Health   Financial Resource Strain: Not on file  Food Insecurity: Not on file  Transportation Needs: Not on file  Physical Activity: Not on file  Stress: Not on file  Social Connections: Not on file   Family History  Problem Relation Age of Onset   Heart  disease Other    Heart attack Mother    Dementia Father    Heart disease Brother 50   Allergies  Allergen Reactions   Ace Inhibitors Cough   Lisinopril Cough   Prior to Admission medications   Medication Sig Start Date End Date Taking? Authorizing Provider  allopurinol (ZYLOPRIM) 100 MG tablet Take 100 mg by mouth daily.     Yes [provider]  amLODipine (NORVASC) 5 MG tablet Take 1 tablet (5 mg total) by mouth daily. 08/11/11 06/23/22 Yes Coralyn Helling, MD  aspirin EC 81 MG tablet Take 81 mg by mouth daily.   Yes [provider]  bisoprolol (ZEBETA) 5 MG tablet Take 5 mg by mouth daily.   Yes [provider]  Carboxymethylcellulose Sodium (THERATEARS) 0.25 % SOLN Place 1 drop into both eyes daily as needed (Dry eye).   Yes [provider]  cetirizine (ZYRTEC) 10 MG tablet Take 10 mg by mouth at bedtime.   Yes [provider]  clobetasol (TEMOVATE) 0.05 % external solution Apply 1 Application topically 3 (three) times a week.   Yes [provider]  clobetasol ointment (TEMOVATE) 0.05 % Apply 1 Application  topically daily as needed (back).   Yes [provider]  desonide (DESOWEN) 0.05 % cream Apply 1 Application topically daily as needed (rash on face).   Yes [provider]  fluocinonide (LIDEX) 0.05 % external solution Apply 1 Application topically 3 (three) times daily as needed (scalp).   Yes [provider]  fluticasone (FLONASE) 50 MCG/ACT nasal spray Place 2 sprays into both nostrils 2 (two) times daily as needed for rhinitis.   Yes [provider]  fluticasone-salmeterol (WIXELA INHUB) 250-50 MCG/ACT AEPB Inhale 1 puff into the lungs in the morning and at bedtime.   Yes [provider]  hydrocortisone 2.5 % cream Apply 1 Application topically daily as needed (dry skin on face).   Yes [provider]  ketoconazole (NIZORAL) 2 % cream Apply 1 Application topically daily as needed  for irritation.   Yes [provider]  ketoconazole (NIZORAL) 2 % shampoo Apply 1 Application topically 2 (two) times a week.   Yes [provider]  Ketotifen Fumarate ([employer] ANTIHISTAMINE EYE DROPS OP) Place 1 drop into both eyes 2 (two) times a week.   Yes [provider]  losartan (COZAAR) 100 MG tablet Take 100 mg by mouth daily.   Yes [provider]  metFORMIN (GLUCOPHAGE) 500 MG tablet Take 500 mg by mouth 2 (two) times daily with a meal.   Yes [provider]  montelukast (SINGULAIR) 10 MG tablet Take 10 mg by mouth at bedtime.   Yes [provider]  rosuvastatin (CRESTOR) 20 MG tablet Take 20 mg by mouth daily.   Yes [provider]   No results found.  Positive ROS: All other systems have been reviewed and were otherwise negative with the exception of those mentioned in the HPI and as above.  Physical Exam: General: Alert, no acute distress Cardiovascular: No pedal edema Respiratory: No cyanosis, no use of accessory musculature GI: No organomegaly, abdomen is soft and non-tender Skin: No lesions in the area of chief complaint Neurologic: Sensation intact distally Psychiatric: Patient is competent for consent with normal mood and affect Lymphatic: No axillary or cervical lymphadenopathy  MUSCULOSKELETAL: Left lower extremity is warm and well-perfused with no open wounds or lesions.  Neurovascular intact.  Assessment: Left knee acute medial meniscus tear, complex.  Plan: -Plan to proceed today with arthroscopic intervention on the left knee.  We discussed arthroscopic partial medial meniscectomy and debridement.  We discussed the risk and benefits of the procedure including but not limited to bleeding, infection, damage to surrounding nerves and vessels, stiffness, failure of pain relief, persistent pain, arthritis, DVT, the risk of anesthesia.  He has provided informed consent.  -Plan for discharge home  postoperatively from PACU.    Yolonda Kida, MD Cell [phone]    06/23/2022 12:32 PM

## 2022-06-23 NOTE — Anesthesia Procedure Notes (Signed)
Procedure Name: LMA Insertion Date/Time: 06/23/2022 12:53 PM  Performed by: Shary Decamp, CRNAPre-anesthesia Checklist: Patient identified, Emergency Drugs available, Suction available and Patient being monitored Patient Re-evaluated:Patient Re-evaluated prior to induction Oxygen Delivery Method: Circle system utilized Preoxygenation: Pre-oxygenation with 100% oxygen Induction Type: IV induction Ventilation: Mask ventilation without difficulty LMA: LMA inserted LMA Size: 5.0 Number of attempts: 1 Placement Confirmation: positive ETCO2 and breath sounds checked- equal and bilateral Tube secured with: Tape Dental Injury: Teeth and Oropharynx as per pre-operative assessment

## 2022-06-24 ENCOUNTER — Encounter (HOSPITAL_COMMUNITY): Payer: Self-pay | Admitting: Orthopedic Surgery

## 2023-11-08 NOTE — Progress Notes (Signed)
Structural Heart Clinic Consult Note  Chief Complaint  Patient presents with   New Patient (Initial Visit)    Severe aortic stenosis   History of Present Illness: 77 yo [name] with history of HTN, HLD, DM, PAD, CAD with prior stenting of the Circumflex artery in 2005, COPD, prior tobacco abuse and severe aortic stenosis who is here today as a new consult, referred by Jesus Gibbs, PA at the [facility], to discuss his CAD and severe aortic stenosis, possible PCI and TAVR. Echo November 2024 at the [place] in [place], [place] with LVEF=60-65%. Severe aortic stenosis with mean gradient 49 mmHg, AVA 0.4 cm2, DI 0.13. Moderate aortic valve insufficiency. Mild MR. I was able to load the echo images into our viewing system and reviewed the images. The aortic valve leaflets are thickened and calcified and do not open well. Cardiac cath 10/31/23 at the [facility] in [place], [place] with diffuse disease in the Circumflex artery (50% proximal, 90% distal, 50% ostial disease in the obtuse marginal branches), 50% proximal LAD stenosis, small non-dominant RCA with 70% ostial stenosis. I personally reviewed the cath films. The RCA is too small for PCI. The Circumflex is diffusely diseased. The distal AV groove Circumflex has severe stenosis but this vessel is small and I don't think PCI is indicated in this vessel in the absence of angina.   He tells me today that he has had dizziness. One severe episode of dizziness late summer while mowing the grass. No severe episodes since then. e has no chest pain or dyspnea with exertion. No LE edema.  He lives in [place], [place] by himself. He is retired from [employer]. He has lost some teeth. He has some ongoing issues with tooth pain and tooth loss. He is planning to see a dentist.   [facility] Primary Cardiology: Jesus [name], [place] Referring Cardiology: Jesus Gibbs, PA  Past Medical History:  Diagnosis Date   ACE-inhibitor cough     COPD (chronic obstructive pulmonary disease) (HCC)    Diabetes mellitus without complication (HCC)    GERD (gastroesophageal reflux disease)    GI bleed    on Plavix and ECASA   Heart attack (HCC)    Hyperlipidemia    Myocardial infarction [facility])     Past Surgical History:  Procedure Laterality Date   CATARACT EXTRACTION, BILATERAL     CERVICAL SPINE SURGERY  2006   C6-C7   Coronary stenting  2005   HAND TENDON SURGERY     KNEE ARTHROSCOPY WITH MEDIAL MENISECTOMY Left 06/23/2022   Procedure: KNEE ARTHROSCOPY WITH PARTIAL MEDIAL MENISECTOMY;  Surgeon: Yolonda Kida, MD;  Location: [facility] OR;  Service: Orthopedics;  Laterality: Left;  60   PLANTAR FASCIA SURGERY     ROTATOR CUFF REPAIR     left    Current Outpatient Medications  Medication Sig Dispense Refill   allopurinol (ZYLOPRIM) 100 MG tablet Take 100 mg by mouth daily.       amLODipine (NORVASC) 5 MG tablet Take 1 tablet (5 mg total) by mouth daily. 90 tablet 0   aspirin EC 81 MG tablet Take 81 mg by mouth daily.     bisoprolol (ZEBETA) 5 MG tablet Take 5 mg by mouth daily.     Carboxymethylcellulose Sodium (THERATEARS) 0.25 % SOLN Place 1 drop into both eyes daily as needed (Dry eye).     cetirizine (ZYRTEC) 10 MG tablet Take 10 mg by mouth at bedtime.  clobetasol (TEMOVATE) 0.05 % external solution Apply 1 Application topically 3 (three) times a week.     clobetasol ointment (TEMOVATE) 0.05 % Apply 1 Application topically daily as needed (back).     cyanocobalamin (VITAMIN B12) 500 MCG tablet Take by mouth.     desonide (DESOWEN) 0.05 % cream Apply 1 Application topically daily as needed (rash on face).     diclofenac Sodium (VOLTAREN) 1 % GEL Apply topically.     fluocinonide (LIDEX) 0.05 % external solution Apply 1 Application topically 3 (three) times daily as needed (scalp).     fluticasone (FLONASE) 50 MCG/ACT nasal spray Place 2 sprays into both nostrils 2 (two) times daily as needed for rhinitis.      fluticasone-salmeterol (WIXELA INHUB) 250-50 MCG/ACT AEPB Inhale 1 puff into the lungs in the morning and at bedtime.     hydrocortisone 2.5 % cream Apply 1 Application topically daily as needed (dry skin on face).     ipratropium (ATROVENT) 0.06 % nasal spray Place into the nose.     ketoconazole (NIZORAL) 2 % cream Apply 1 Application topically daily as needed for irritation.     ketoconazole (NIZORAL) 2 % shampoo Apply 1 Application topically 2 (two) times a week.     Ketotifen Fumarate ([employer] ANTIHISTAMINE EYE DROPS OP) Place 1 drop into both eyes 2 (two) times a week.     losartan (COZAAR) 100 MG tablet Take 100 mg by mouth daily.     metFORMIN (GLUCOPHAGE) 500 MG tablet Take 500 mg by mouth 2 (two) times daily with a meal.     montelukast (SINGULAIR) 10 MG tablet Take 10 mg by mouth at bedtime.     rosuvastatin (CRESTOR) 20 MG tablet Take 20 mg by mouth daily.     tiotropium (SPIRIVA) 18 MCG inhalation capsule Place 18 mcg into inhaler and inhale daily.     No current facility-administered medications for this visit.    Allergies  Allergen Reactions   Ace Inhibitors Cough   Lisinopril Cough    Social History   Socioeconomic History   Marital status: Single    Spouse name: Not on file   Number of children: Not on file   Years of education: Not on file   Highest education level: Not on file  Occupational History   Not on file  Tobacco Use   Smoking status: Former    Current packs/day: 0.00    Average packs/day: 3.0 packs/day for 50.0 years (150.0 ttl pk-yrs)    Types: Cigarettes    Start date: 12/21/1960    Quit date: 12/21/2010    Years since quitting: 12.8   Smokeless tobacco: Not on file  Vaping Use   Vaping status: Never Used  Substance and Sexual Activity   Alcohol use: No   Drug use: No   Sexual activity: Not on file  Other Topics Concern   Not on file  Social History Narrative   Not on file   Social Drivers of Health   Financial Resource Strain: Not on file   Food Insecurity: Not on file  Transportation Needs: Not on file  Physical Activity: Not on file  Stress: Not on file  Social Connections: Not on file  Intimate Partner Violence: Not on file    Family History  Problem Relation Age of Onset   Heart disease Other    Heart attack Mother    Dementia Father    Heart disease Brother 63    Review of Systems:  As stated in the HPI and otherwise negative.   BP 110/72 (BP Location: Left Arm, Patient Position: Sitting)   Pulse 73   Ht 5\' 5"  (1.651 m)   Wt 82.6 kg   SpO2 98%   BMI 30.30 kg/m   Physical Examination: General: Well developed, well nourished, NAD  HEENT: OP clear, mucus membranes moist  SKIN: warm, dry. No rashes. Neuro: No focal deficits  Musculoskeletal: Muscle strength 5/5 all ext  Psychiatric: Mood and affect normal  Neck: No JVD, no carotid bruits, no thyromegaly, no lymphadenopathy.  Lungs:Clear bilaterally, no wheezes, rhonci, crackles Cardiovascular: Regular rate and rhythm. Loud, harsh, late peaking systolic murmur.  Abdomen:Soft. Bowel sounds present. Non-tender.  Extremities: No lower extremity edema. Pulses are 2 + in the bilateral DP/PT.  EKG:  EKG is ordered today. The ekg ordered today demonstrates  EKG Interpretation Date/Time:  Friday November 09 2023 11:25:48 EST Ventricular Rate:  68 PR Interval:  194 QRS Duration:  102 QT Interval:  378 QTC Calculation: 401 R Axis:   -8  Text Interpretation: Normal sinus rhythm Inferior infarct (cited on or before 23-Jun-2022) Confirmed by Verne Carrow [phone]) on 11/09/2023 11:39:26 AM    Echo November 2024 at the [place] in [place], [place] with LVEF=60-65%. Severe aortic stenosis with mean gradient 49 mmHg, AVA 0.4 cm2, DI 0.13. Moderate aortic valve insufficiency. Mild MR.   Recent Labs: No results found for requested labs within last 365 days.     Wt Readings from Last 3 Encounters:  11/09/23 82.6 kg  06/23/22 82.6 kg  02/15/15 84.4 kg     Assessment and Plan:   1. Severe Aortic Valve Stenosis: He has severe/critical stage D1 aortic valve stenosis. He has NYHA class 2 symptoms. I have personally reviewed the echo images. The aortic valve is thickened and calcified with limited leaflet mobility. I think he would benefit from AVR. Given advanced age, He is not a good candidate for conventional AVR by surgical approach. I think he may be a good candidate for TAVR.   I have reviewed the natural history of aortic stenosis with the patient and their family members  who are present today. We have discussed the limitations of medical therapy and the poor prognosis associated with symptomatic aortic stenosis. We have reviewed potential treatment options, including palliative medical therapy, conventional surgical aortic valve replacement, and transcatheter aortic valve replacement. We discussed treatment options in the context of the patient's specific comorbid medical conditions.   He would like to proceed with planning for TAVR. Risks and benefits of the valve procedure are reviewed with the patient. I will arrange a cardiac CT, CTA of the chest/abdomen and pelvis and he will then be referred to see one of the CT surgeons on our TAVR team.   BMET and CBC today  2. Coronary artery disease without angina: I reviewed his cath films from this month. I think we should proceed with medical management of CAD. He has no angina. The distal Circumflex disease is in a diffusely diseased, overall small caliber vessel. No severe proximal Circumflex or LAD stenosis.     Labs/ tests ordered today include:  Orders Placed This Encounter  Procedures   CBC   Basic Metabolic Panel (BMET)   EKG 12-Lead   Disposition:   F/U will be arranged with the structural team  Signed, Verne Carrow, MD, [facility] 11/09/2023 1:35 PM    [facility] [address] [place], [place], [place]  [address] Phone: [phone];  Fax: [phone]

## 2023-11-09 ENCOUNTER — Ambulatory Visit
Payer: No Typology Code available for payment source | Attending: Cardiovascular Disease | Admitting: Cardiovascular Disease

## 2023-11-09 ENCOUNTER — Other Ambulatory Visit: Payer: Self-pay

## 2023-11-09 ENCOUNTER — Encounter: Payer: Self-pay | Admitting: Cardiovascular Disease

## 2023-11-09 VITALS — BP 110/72 | HR 73 | Ht 65.0 in | Wt 182.1 lb

## 2023-11-09 DIAGNOSIS — I2510 Atherosclerotic heart disease of native coronary artery without angina pectoris: Secondary | ICD-10-CM | POA: Diagnosis not present

## 2023-11-09 DIAGNOSIS — Z01812 Encounter for preprocedural laboratory examination: Secondary | ICD-10-CM | POA: Diagnosis not present

## 2023-11-09 DIAGNOSIS — I350 Nonrheumatic aortic (valve) stenosis: Secondary | ICD-10-CM

## 2023-11-09 NOTE — Patient Instructions (Addendum)
Medication Instructions:  No changes *If you need a refill on your cardiac medications before your next appointment, please call your pharmacy*   Lab Work: Go to the [facility] on 1st floor for blood work today (cbc, bmet)   Testing/Procedures: CT scans - will be arranged by Structural Heart Team   Follow-Up: Per Structural Heart Team

## 2023-11-09 NOTE — Progress Notes (Signed)
Pre Surgical Assessment: 5 M Walk Test  50M=16.33ft  5 Meter Walk Test- trial 1: 4.21 seconds 5 Meter Walk Test- trial 2: 4.16 seconds 5 Meter Walk Test- trial 3: 4.35 seconds 5 Meter Walk Test Average: 4.24 seconds

## 2023-11-10 LAB — CBC
Hematocrit: 41.3 % (ref 37.5–51.0)
Hemoglobin: 13.3 g/dL (ref 13.0–17.7)
MCH: 28.5 pg (ref 26.6–33.0)
MCHC: 32.2 g/dL (ref 31.5–35.7)
MCV: 88 fL (ref 79–97)
Platelets: 245 10*3/uL (ref 150–450)
RBC: 4.67 x10E6/uL (ref 4.14–5.80)
RDW: 14.5 % (ref 11.6–15.4)
WBC: 9.6 10*3/uL (ref 3.4–10.8)

## 2023-11-10 LAB — BASIC METABOLIC PANEL
BUN/Creatinine Ratio: 13 (ref 10–24)
BUN: 16 mg/dL (ref 8–27)
CO2: 21 mmol/L (ref 20–29)
Calcium: 9.1 mg/dL (ref 8.6–10.2)
Chloride: 106 mmol/L (ref 96–106)
Creatinine, Ser: 1.28 mg/dL — ABNORMAL HIGH (ref 0.76–1.27)
Glucose: 105 mg/dL — ABNORMAL HIGH (ref 70–99)
Potassium: 4.6 mmol/L (ref 3.5–5.2)
Sodium: 142 mmol/L (ref 134–144)
eGFR: 57 mL/min/{1.73_m2} — ABNORMAL LOW (ref >59)

## 2023-11-27 ENCOUNTER — Ambulatory Visit (HOSPITAL_COMMUNITY)
Admission: RE | Admit: 2023-11-27 | Discharge: 2023-11-27 | Disposition: A | Discharge status: Home / Self Care | Payer: No Typology Code available for payment source | Source: Ambulatory Visit | Attending: Cardiovascular Disease | Admitting: Cardiovascular Disease

## 2023-11-27 DIAGNOSIS — Z01810 Encounter for preprocedural cardiovascular examination: Secondary | ICD-10-CM | POA: Insufficient documentation

## 2023-11-27 DIAGNOSIS — R911 Solitary pulmonary nodule: Secondary | ICD-10-CM | POA: Insufficient documentation

## 2023-11-27 DIAGNOSIS — N281 Cyst of kidney, acquired: Secondary | ICD-10-CM | POA: Insufficient documentation

## 2023-11-27 DIAGNOSIS — I350 Nonrheumatic aortic (valve) stenosis: Secondary | ICD-10-CM | POA: Diagnosis present

## 2023-11-27 DIAGNOSIS — I517 Cardiomegaly: Secondary | ICD-10-CM | POA: Insufficient documentation

## 2023-11-27 DIAGNOSIS — I700 Atherosclerosis of aorta: Secondary | ICD-10-CM | POA: Diagnosis not present

## 2023-11-27 DIAGNOSIS — Z01818 Encounter for other preprocedural examination: Secondary | ICD-10-CM | POA: Diagnosis present

## 2023-11-27 DIAGNOSIS — J439 Emphysema, unspecified: Secondary | ICD-10-CM | POA: Insufficient documentation

## 2023-11-27 DIAGNOSIS — K5730 Diverticulosis of large intestine without perforation or abscess without bleeding: Secondary | ICD-10-CM | POA: Insufficient documentation

## 2023-11-27 MED ORDER — IOHEXOL 350 MG/ML SOLN
95.0000 mL | Freq: Once | INTRAVENOUS | Status: AC | PRN
  Administered 2023-11-27: 95 mL via INTRAVENOUS

## 2023-11-28 ENCOUNTER — Inpatient Hospital Stay
Admission: RE | Admit: 2023-11-28 | Discharge: 2023-11-28 | Disposition: A | Discharge status: Home / Self Care | Payer: Self-pay | Source: Ambulatory Visit | Attending: Cardiovascular Disease | Admitting: Cardiovascular Disease

## 2023-11-28 ENCOUNTER — Other Ambulatory Visit: Payer: Self-pay

## 2023-11-28 DIAGNOSIS — I350 Nonrheumatic aortic (valve) stenosis: Secondary | ICD-10-CM

## 2023-12-05 NOTE — Progress Notes (Signed)
[address]       [place] [address]             [phone]           [name]. [place] Medical Record #[id number] Date of Birth: [birth year]  [facility]  Chief Complaint: Aortic Stenosis    History of Present Illness:     Pt is a 78 yo male with previous CAD stenting in 2005 who is followed by the [place]. Pt had some dizziness last summer with mowing the lawn and recent echo last novermber with severe AS with a mean gradient of and with normal LV function. He underwent cath also at [facility] where moderate CAD was noted with 50% LAD, 70% mid circumflex stenosis and an ostial stenosis in the nondominant RCA. Pt has no angina and denies DOE or ongoing light headedness. He does admit to some increased SOB over his usual COPD over the past year.  Pt was evaluated by Dr Sanjuana Kava and was felt due to the significant AS that he would best be served with TAVR and CTA was obtained. He has significant aortoiliac stenosis that is felt acceptable for femoral access but may need shockwave. Pt has poor dentition that he currently has appt with dentist end of February      Past Medical History:  Diagnosis Date   ACE-inhibitor cough    COPD (chronic obstructive pulmonary disease) (HCC)    Diabetes mellitus without complication (HCC)    GERD (gastroesophageal reflux disease)    GI bleed    on Plavix and ECASA   Heart attack (HCC)    Hyperlipidemia    Myocardial infarction [facility])     Past Surgical History:  Procedure Laterality Date   CATARACT EXTRACTION, BILATERAL     CERVICAL SPINE SURGERY  2006   C6-C7   Coronary stenting  2005   HAND TENDON SURGERY     KNEE ARTHROSCOPY WITH MEDIAL MENISECTOMY Left 06/23/2022   Procedure: KNEE ARTHROSCOPY WITH PARTIAL MEDIAL MENISECTOMY;  Surgeon: Yolonda Kida, MD;  Location: [facility] OR;  Service: Orthopedics;  Laterality: Left;  60   PLANTAR FASCIA SURGERY     ROTATOR CUFF REPAIR      left    Social History   Tobacco Use  Smoking Status Former   Current packs/day: 0.00   Average packs/day: 3.0 packs/day for 50.0 years (150.0 ttl pk-yrs)   Types: Cigarettes   Start date: 12/21/1960   Quit date: 12/21/2010   Years since quitting: 12.9  Smokeless Tobacco Not on file    Social History   Substance and Sexual Activity  Alcohol Use No    Social History   Socioeconomic History   Marital status: Single    Spouse name: Not on file   Number of children: Not on file   Years of education: Not on file   Highest education level: Not on file  Occupational History   Not on file  Tobacco Use   Smoking status: Former    Current packs/day: 0.00    Average packs/day: 3.0 packs/day for 50.0 years (150.0 ttl pk-yrs)    Types: Cigarettes    Start date: 12/21/1960    Quit date: 12/21/2010    Years since quitting: 12.9   Smokeless tobacco: Not on file  Vaping Use   Vaping status: Never Used  Substance and Sexual Activity   Alcohol use: No   Drug use: No  Sexual activity: Not on file  Other Topics Concern   Not on file  Social History Narrative   Not on file   Social Drivers of Health   Financial Resource Strain: Not on file  Food Insecurity: Not on file  Transportation Needs: Not on file  Physical Activity: Not on file  Stress: Not on file  Social Connections: Not on file  Intimate Partner Violence: Not on file    Allergies  Allergen Reactions   Ace Inhibitors Cough   Lisinopril Cough    Current Outpatient Medications  Medication Sig Dispense Refill   allopurinol (ZYLOPRIM) 100 MG tablet Take 100 mg by mouth daily.       amLODipine (NORVASC) 5 MG tablet Take 1 tablet (5 mg total) by mouth daily. 90 tablet 0   aspirin EC 81 MG tablet Take 81 mg by mouth daily.     bisoprolol (ZEBETA) 5 MG tablet Take 5 mg by mouth daily.     Carboxymethylcellulose Sodium (THERATEARS) 0.25 % SOLN Place 1 drop into both eyes daily as needed (Dry eye).     cetirizine (ZYRTEC)  10 MG tablet Take 10 mg by mouth at bedtime.     clobetasol (TEMOVATE) 0.05 % external solution Apply 1 Application topically 3 (three) times a week.     clobetasol ointment (TEMOVATE) 0.05 % Apply 1 Application topically daily as needed (back).     cyanocobalamin (VITAMIN B12) 500 MCG tablet Take by mouth.     desonide (DESOWEN) 0.05 % cream Apply 1 Application topically daily as needed (rash on face).     diclofenac Sodium (VOLTAREN) 1 % GEL Apply topically.     fluocinonide (LIDEX) 0.05 % external solution Apply 1 Application topically 3 (three) times daily as needed (scalp).     fluticasone (FLONASE) 50 MCG/ACT nasal spray Place 2 sprays into both nostrils 2 (two) times daily as needed for rhinitis.     fluticasone-salmeterol (WIXELA INHUB) 250-50 MCG/ACT AEPB Inhale 1 puff into the lungs in the morning and at bedtime.     hydrocortisone 2.5 % cream Apply 1 Application topically daily as needed (dry skin on face).     ipratropium (ATROVENT) 0.06 % nasal spray Place into the nose.     ketoconazole (NIZORAL) 2 % cream Apply 1 Application topically daily as needed for irritation.     ketoconazole (NIZORAL) 2 % shampoo Apply 1 Application topically 2 (two) times a week.     Ketotifen Fumarate ([employer]S ANTIHISTAMINE EYE DROPS OP) Place 1 drop into both eyes 2 (two) times a week.     losartan (COZAAR) 100 MG tablet Take 100 mg by mouth daily.     metFORMIN (GLUCOPHAGE) 500 MG tablet Take 500 mg by mouth 2 (two) times daily with a meal.     montelukast (SINGULAIR) 10 MG tablet Take 10 mg by mouth at bedtime.     rosuvastatin (CRESTOR) 20 MG tablet Take 20 mg by mouth daily.     tiotropium (SPIRIVA) 18 MCG inhalation capsule Place 18 mcg into inhaler and inhale daily.     No current facility-administered medications for this visit.     Family History  Problem Relation Age of Onset   Heart disease Other    Heart attack Mother    Dementia Father    Heart disease Brother 84       Physical  Exam: Lungs: Clear Card: rr with 3/6 sem Ext: No edema Teeth in poor repair Neuro; intact  Diagnostic Studies & Laboratory data: I have personally reviewed the following studies and agree with the findings     Recent Radiology Findings:   CTA (11/2023) FINDINGS: Aortic Valve:   Tricuspid aortic valve with severely reduced cusp excursion. Severely thickened and severely calcified aortic valve cusps.   AV calcium score: 2717   Virtual Basal Annulus Measurements: 25% R-R   Maximum/Minimum Diameter: 25.9 x 22.4 mm   Perimeter: 73.7 mm   Area:  417 mm2   No significant LVOT calcifications.   Membranous septal length: 6 mm   Based on these measurements, the annulus would be suitable for a 23 mm Sapien 3 valve. Alternatively, Heart Team can consider 29 mm Evolut valve. Recommend Heart Team discussion for valve selection.   Sinus of Valsalva Measurements:   Non-coronary:  29 mm   Right - coronary:  29 mm   Left - coronary:  30 mm   Sinus of Valsalva Height:   Left: 20 mm   Right: 19 mm   Aorta: Conventional 3 vessel branch pattern of aortic arch.   Sinotubular Junction:  26 mm   Ascending Thoracic Aorta:  32 mm   Aortic Arch:  27 mm   Descending Thoracic Aorta at level of PA bifurcation:  25 mm   Coronary Artery Height above Annulus:   Left main: 14 mm   Right coronary: 15 mm   Coronary Arteries: Normal coronary origin. Right dominance. The study was performed without use of NTG and insufficient for plaque evaluation. S/p prior PCI to LCx.   Optimum Fluoroscopic Angle for Delivery: LAO 5 CAU 6   OTHER:   Atria: Grossly normal, mild LA dilation.   Left atrial appendage: No thrombus.   Mitral valve: Grossly normal, no mitral annular calcifications.   Pulmonary artery: Normal caliber.   Pulmonary veins: Normal anatomy.   IMPRESSION: 1. Tricuspid aortic valve with severely reduced cusp excursion. Severely thickened and severely calcified  aortic valve cusps. 2. Aortic valve calcium score: 2717 3. Annulus area: 417 mm2, suitable for 23 mm Sapien 3 valve. No LVOT calcifications. Membranous septal length 6 mm. 4. Sufficient coronary artery heights from annulus. 5. Optimum fluoroscopic angle for delivery: LAO 5 CAU 6      Recent Lab Findings:     Assessment / Plan:     78 yo male with NYHA class 1 symptoms of severe AS with normal LV function and moderate CAD. Pt would best be served with AVR and due to his advanced age would benefit from TAVR. He has aortoiliac disease but should be doable with femoral access with a Sapien 23mm valve. We discussed all the risk and goals and recovery from TAVR and he wishes to proceed. He would be a bail out candidate. He will let team know after he sees dentist to arrange procedure   I have spent 60 min in review of the records, viewing studies and in face to face with patient and in coordination of future care    Eugenio Hoes 12/05/2023 3:40 PM

## 2023-12-06 ENCOUNTER — Institutional Professional Consult (permissible substitution): Payer: No Typology Code available for payment source | Admitting: Thoracic Surgery (Cardiothoracic Vascular Surgery)

## 2023-12-06 ENCOUNTER — Encounter: Payer: Self-pay | Admitting: Thoracic Surgery (Cardiothoracic Vascular Surgery)

## 2023-12-06 VITALS — BP 119/68 | HR 73 | Resp 20 | Ht 65.0 in | Wt 182.0 lb

## 2023-12-06 DIAGNOSIS — I350 Nonrheumatic aortic (valve) stenosis: Secondary | ICD-10-CM | POA: Diagnosis not present

## 2023-12-06 NOTE — Patient Instructions (Signed)
TAVR

## 2023-12-07 ENCOUNTER — Ambulatory Visit: Payer: No Typology Code available for payment source | Admitting: Cardiology

## 2023-12-18 ENCOUNTER — Telehealth: Payer: Self-pay

## 2023-12-18 NOTE — Telephone Encounter (Signed)
The patient has undergone dental evaluation with Dr Rich Number with [facility] and he was found to have active infection (abscess) in 5 teeth and was started on an antibiotic (confirmed with Katie at dental office). Per Dr Lyndal Rainbow there are 11 other teeth that have the potential to become abscessed.  [name] is hoping that he can have the 5 teeth extracted and then proceed with TAVR.  After TAVR is complete the patient plans to have all remaining teeth extracted in a staged fashion due to financial concerns and his goal is to have full dentures.  I advised the patient that I would make Drs Clifton James and Leafy Ro aware of dental findings and they can determine if the patient is safe to proceed with TAVR after extracting the 5 infected teeth or if additional teeth should be extracted.

## 2023-12-19 NOTE — Telephone Encounter (Signed)
I spoke with the patient and advised him that he needs to have the 5 abscessed teeth extracted prior to proceeding with TAVR.  The patient will contact the dental office and make arrangements.    I spoke with [name] at Dr [facility] office and made her aware that at this time the patient only needs the 5 abscessed teeth extracted.  She will make Dr Lyndal Rainbow aware and assist the patient with arranging extractions.

## 2024-01-25 ENCOUNTER — Other Ambulatory Visit: Payer: Self-pay

## 2024-01-25 DIAGNOSIS — I350 Nonrheumatic aortic (valve) stenosis: Secondary | ICD-10-CM

## 2024-02-01 ENCOUNTER — Other Ambulatory Visit: Payer: Self-pay

## 2024-02-01 ENCOUNTER — Encounter (HOSPITAL_COMMUNITY)
Admission: RE | Admit: 2024-02-01 | Discharge: 2024-02-01 | Disposition: A | Discharge status: Home / Self Care | Payer: No Typology Code available for payment source | Source: Ambulatory Visit | Attending: Cardiovascular Disease | Admitting: Cardiovascular Disease

## 2024-02-01 ENCOUNTER — Ambulatory Visit (HOSPITAL_COMMUNITY)
Admission: RE | Admit: 2024-02-01 | Discharge: 2024-02-01 | Disposition: A | Discharge status: Home / Self Care | Source: Ambulatory Visit | Attending: Cardiovascular Disease | Admitting: Cardiovascular Disease

## 2024-02-01 DIAGNOSIS — Z01818 Encounter for other preprocedural examination: Secondary | ICD-10-CM | POA: Diagnosis present

## 2024-02-01 DIAGNOSIS — I350 Nonrheumatic aortic (valve) stenosis: Secondary | ICD-10-CM | POA: Diagnosis not present

## 2024-02-01 DIAGNOSIS — R9431 Abnormal electrocardiogram [ECG] [EKG]: Secondary | ICD-10-CM | POA: Insufficient documentation

## 2024-02-01 LAB — PROTIME-INR
INR: 1.1 (ref 0.8–1.2)
Prothrombin Time: 14.5 s (ref 11.4–15.2)

## 2024-02-01 LAB — COMPREHENSIVE METABOLIC PANEL
ALT: 15 U/L (ref 0–44)
AST: 29 U/L (ref 15–41)
Albumin: 3.5 g/dL (ref 3.5–5.0)
Alkaline Phosphatase: 63 U/L (ref 38–126)
Anion gap: 5 (ref 5–15)
BUN: 14 mg/dL (ref 8–23)
CO2: 25 mmol/L (ref 22–32)
Calcium: 8.8 mg/dL — ABNORMAL LOW (ref 8.9–10.3)
Chloride: 108 mmol/L (ref 98–111)
Creatinine, Ser: 1.27 mg/dL — ABNORMAL HIGH (ref 0.61–1.24)
GFR, Estimated: 57 mL/min — ABNORMAL LOW (ref >60)
Glucose, Bld: 116 mg/dL — ABNORMAL HIGH (ref 70–99)
Potassium: 4.2 mmol/L (ref 3.5–5.1)
Sodium: 138 mmol/L (ref 135–145)
Total Bilirubin: 0.7 mg/dL (ref 0.0–1.2)
Total Protein: 7.3 g/dL (ref 6.5–8.1)

## 2024-02-01 LAB — CBC
HCT: 42.8 % (ref 39.0–52.0)
Hemoglobin: 13.9 g/dL (ref 13.0–17.0)
MCH: 27.7 pg (ref 26.0–34.0)
MCHC: 32.5 g/dL (ref 30.0–36.0)
MCV: 85.4 fL (ref 80.0–100.0)
Platelets: 251 10*3/uL (ref 150–400)
RBC: 5.01 MIL/uL (ref 4.22–5.81)
RDW: 15.1 % (ref 11.5–15.5)
WBC: 8.6 10*3/uL (ref 4.0–10.5)
nRBC: 0 % (ref 0.0–0.2)

## 2024-02-01 LAB — URINALYSIS, ROUTINE W REFLEX MICROSCOPIC
Bilirubin Urine: NEGATIVE
Glucose, UA: NEGATIVE mg/dL
Hgb urine dipstick: NEGATIVE
Ketones, ur: NEGATIVE mg/dL
Nitrite: NEGATIVE
Protein, ur: NEGATIVE mg/dL
Specific Gravity, Urine: 1.014 (ref 1.005–1.030)
pH: 5 (ref 5.0–8.0)

## 2024-02-01 LAB — TYPE AND SCREEN
ABO/RH(D): O POS
Antibody Screen: NEGATIVE

## 2024-02-01 LAB — SURGICAL PCR SCREEN
MRSA, PCR: NEGATIVE
Staphylococcus aureus: NEGATIVE

## 2024-02-01 NOTE — Progress Notes (Signed)
 Patient signed all consents at PAT lab appointment. CHG soap and instructions were given to patient. CHG surgical prep reviewed with patient and all questions answered.  Patients chart send to anesthesia for review. Pt denies any respiratory illness/infection in the last two months.

## 2024-02-04 ENCOUNTER — Encounter (HOSPITAL_COMMUNITY): Payer: Self-pay | Admitting: Cardiovascular Disease

## 2024-02-04 MED ORDER — MAGNESIUM SULFATE 50 % IJ SOLN
40.0000 meq | INTRAMUSCULAR | Status: DC
  Filled 2024-02-04 (×2): qty 9.85

## 2024-02-04 MED ORDER — HEPARIN 30,000 UNITS/1000 ML (OHS) CELLSAVER SOLUTION
Status: DC
  Filled 2024-02-04 (×2): qty 1000

## 2024-02-04 MED ORDER — POTASSIUM CHLORIDE 2 MEQ/ML IV SOLN
80.0000 meq | INTRAVENOUS | Status: DC
  Filled 2024-02-04 (×2): qty 40

## 2024-02-04 MED ORDER — DEXMEDETOMIDINE HCL IN NACL 400 MCG/100ML IV SOLN
0.1000 ug/kg/h | INTRAVENOUS | Status: AC
Start: 2024-02-05 — End: 2024-02-06
  Administered 2024-02-05: 1 ug/kg/h via INTRAVENOUS
  Administered 2024-02-05: 82.6 ug via INTRAVENOUS
  Filled 2024-02-04: qty 100

## 2024-02-04 MED ORDER — DEXMEDETOMIDINE HCL IN NACL 400 MCG/100ML IV SOLN: 0.1000 ug/kg/h | INTRAVENOUS | Status: DC

## 2024-02-04 MED ORDER — CEFAZOLIN SODIUM-DEXTROSE 2-4 GM/100ML-% IV SOLN
2.0000 g | INTRAVENOUS | Status: AC
Start: 2024-02-05 — End: 2024-02-06
  Administered 2024-02-05: 2 g via INTRAVENOUS
  Filled 2024-02-04: qty 100

## 2024-02-04 MED ORDER — NOREPINEPHRINE 4 MG/250ML-% IV SOLN
0.0000 ug/min | INTRAVENOUS | Status: AC
  Administered 2024-02-05: 2 ug/min via INTRAVENOUS
  Filled 2024-02-04: qty 250

## 2024-02-04 NOTE — H&P (Signed)
 [address]       [place] [address]             [phone]                                   [name]. [place] Medical Record #[id number] Date of Birth: Nov 18, 1946   [facility]   Chief Complaint: Aortic Stenosis     History of Present Illness:     Pt is a 78 yo male with previous CAD stenting in 2005 who is followed by the [place]. Pt had some dizziness last summer with mowing the lawn and recent echo last novermber with severe AS with a mean gradient of and with normal LV function. He underwent cath also at [facility] where moderate CAD was noted with 50% LAD, 70% mid circumflex stenosis and an ostial stenosis in the nondominant RCA. Pt has no angina and denies DOE or ongoing light headedness. He does admit to some increased SOB over his usual COPD over the past year.  Pt was evaluated by Dr Sanjuana Kava and was felt due to the significant AS that he would best be served with TAVR and CTA was obtained. He has significant aortoiliac stenosis that is felt acceptable for femoral access but may need shockwave. Pt has poor dentition that he currently has appt with dentist end of February             Past Medical History:  Diagnosis Date   ACE-inhibitor cough     COPD (chronic obstructive pulmonary disease) (HCC)     Diabetes mellitus without complication (HCC)     GERD (gastroesophageal reflux disease)     GI bleed      on Plavix and ECASA   Heart attack (HCC)     Hyperlipidemia     Myocardial infarction [facility])                 Past Surgical History:  Procedure Laterality Date   CATARACT EXTRACTION, BILATERAL       CERVICAL SPINE SURGERY   2006    C6-C7   Coronary stenting   2005   HAND TENDON SURGERY       KNEE ARTHROSCOPY WITH MEDIAL MENISECTOMY Left 06/23/2022    Procedure: KNEE ARTHROSCOPY WITH PARTIAL MEDIAL MENISECTOMY;  Surgeon: Yolonda Kida, MD;  Location: [facility] OR;  Service: Orthopedics;  Laterality:  Left;  60   PLANTAR FASCIA SURGERY       ROTATOR CUFF REPAIR        left          Tobacco Use History  Social History        Tobacco Use  Smoking Status Former   Current packs/day: 0.00   Average packs/day: 3.0 packs/day for 50.0 years (150.0 ttl pk-yrs)   Types: Cigarettes   Start date: 12/21/1960   Quit date: 12/21/2010   Years since quitting: 12.9  Smokeless Tobacco Not on file      Social History       Substance and Sexual Activity  Alcohol Use No      Social History         Socioeconomic History   Marital status: Single      Spouse name: Not on file   Number of children: Not on file   Years of education: Not on file  Highest education level: Not on file  Occupational History   Not on file  Tobacco Use   Smoking status: Former      Current packs/day: 0.00      Average packs/day: 3.0 packs/day for 50.0 years (150.0 ttl pk-yrs)      Types: Cigarettes      Start date: 12/21/1960      Quit date: 12/21/2010      Years since quitting: 12.9   Smokeless tobacco: Not on file  Vaping Use   Vaping status: Never Used  Substance and Sexual Activity   Alcohol use: No   Drug use: No   Sexual activity: Not on file  Other Topics Concern   Not on file  Social History Narrative   Not on file    Social Drivers of Health    Financial Resource Strain: Not on file  Food Insecurity: Not on file  Transportation Needs: Not on file  Physical Activity: Not on file  Stress: Not on file  Social Connections: Not on file  Intimate Partner Violence: Not on file      Allergies      Allergies  Allergen Reactions   Ace Inhibitors Cough   Lisinopril Cough              Current Outpatient Medications  Medication Sig Dispense Refill   allopurinol (ZYLOPRIM) 100 MG tablet Take 100 mg by mouth daily.         amLODipine (NORVASC) 5 MG tablet Take 1 tablet (5 mg total) by mouth daily. 90 tablet 0   aspirin EC 81 MG tablet Take 81 mg by mouth daily.       bisoprolol (ZEBETA) 5  MG tablet Take 5 mg by mouth daily.       Carboxymethylcellulose Sodium (THERATEARS) 0.25 % SOLN Place 1 drop into both eyes daily as needed (Dry eye).       cetirizine (ZYRTEC) 10 MG tablet Take 10 mg by mouth at bedtime.       clobetasol (TEMOVATE) 0.05 % external solution Apply 1 Application topically 3 (three) times a week.       clobetasol ointment (TEMOVATE) 0.05 % Apply 1 Application topically daily as needed (back).       cyanocobalamin (VITAMIN B12) 500 MCG tablet Take by mouth.       desonide (DESOWEN) 0.05 % cream Apply 1 Application topically daily as needed (rash on face).       diclofenac Sodium (VOLTAREN) 1 % GEL Apply topically.       fluocinonide (LIDEX) 0.05 % external solution Apply 1 Application topically 3 (three) times daily as needed (scalp).       fluticasone (FLONASE) 50 MCG/ACT nasal spray Place 2 sprays into both nostrils 2 (two) times daily as needed for rhinitis.       fluticasone-salmeterol (WIXELA INHUB) 250-50 MCG/ACT AEPB Inhale 1 puff into the lungs in the morning and at bedtime.       hydrocortisone 2.5 % cream Apply 1 Application topically daily as needed (dry skin on face).       ipratropium (ATROVENT) 0.06 % nasal spray Place into the nose.       ketoconazole (NIZORAL) 2 % cream Apply 1 Application topically daily as needed for irritation.       ketoconazole (NIZORAL) 2 % shampoo Apply 1 Application topically 2 (two) times a week.       Ketotifen Fumarate ([employer] ANTIHISTAMINE EYE DROPS OP) Place 1 drop into both eyes 2 (  two) times a week.       losartan (COZAAR) 100 MG tablet Take 100 mg by mouth daily.       metFORMIN (GLUCOPHAGE) 500 MG tablet Take 500 mg by mouth 2 (two) times daily with a meal.       montelukast (SINGULAIR) 10 MG tablet Take 10 mg by mouth at bedtime.       rosuvastatin (CRESTOR) 20 MG tablet Take 20 mg by mouth daily.       tiotropium (SPIRIVA) 18 MCG inhalation capsule Place 18 mcg into inhaler and inhale daily.          No current  facility-administered medications for this visit.               Family History  Problem Relation Age of Onset   Heart disease Other     Heart attack Mother     Dementia Father     Heart disease Brother 50                Physical Exam: Lungs: Clear Card: rr with 3/6 sem Ext: No edema Teeth in poor repair Neuro; intact         Diagnostic Studies & Laboratory data: I have personally reviewed the following studies and agree with the findings     Recent Radiology Findings:   CTA (11/2023) FINDINGS: Aortic Valve:   Tricuspid aortic valve with severely reduced cusp excursion. Severely thickened and severely calcified aortic valve cusps.   AV calcium score: 2717   Virtual Basal Annulus Measurements: 25% R-R   Maximum/Minimum Diameter: 25.9 x 22.4 mm   Perimeter: 73.7 mm   Area:  417 mm2   No significant LVOT calcifications.   Membranous septal length: 6 mm   Based on these measurements, the annulus would be suitable for a 23 mm Sapien 3 valve. Alternatively, Heart Team can consider 29 mm Evolut valve. Recommend Heart Team discussion for valve selection.   Sinus of Valsalva Measurements:   Non-coronary:  29 mm   Right - coronary:  29 mm   Left - coronary:  30 mm   Sinus of Valsalva Height:   Left: 20 mm   Right: 19 mm   Aorta: Conventional 3 vessel branch pattern of aortic arch.   Sinotubular Junction:  26 mm   Ascending Thoracic Aorta:  32 mm   Aortic Arch:  27 mm   Descending Thoracic Aorta at level of PA bifurcation:  25 mm   Coronary Artery Height above Annulus:   Left main: 14 mm   Right coronary: 15 mm   Coronary Arteries: Normal coronary origin. Right dominance. The study was performed without use of NTG and insufficient for plaque evaluation. S/p prior PCI to LCx.   Optimum Fluoroscopic Angle for Delivery: LAO 5 CAU 6   OTHER:   Atria: Grossly normal, mild LA dilation.   Left atrial appendage: No thrombus.   Mitral  valve: Grossly normal, no mitral annular calcifications.   Pulmonary artery: Normal caliber.   Pulmonary veins: Normal anatomy.   IMPRESSION: 1. Tricuspid aortic valve with severely reduced cusp excursion. Severely thickened and severely calcified aortic valve cusps. 2. Aortic valve calcium score: 2717 3. Annulus area: 417 mm2, suitable for 23 mm Sapien 3 valve. No LVOT calcifications. Membranous septal length 6 mm. 4. Sufficient coronary artery heights from annulus. 5. Optimum fluoroscopic angle for delivery: LAO 5 CAU 6       Recent Lab Findings:  Assessment / Plan:     78 yo male with NYHA class 1 symptoms of severe AS with normal LV function and moderate CAD. Pt would best be served with AVR and due to his advanced age would benefit from TAVR. He has aortoiliac disease but should be doable with femoral access with a Sapien 23mm valve. We discussed all the risk and goals and recovery from TAVR and he wishes to proceed. He would be a bail out candidate. He will let team know after he sees dentist to arrange procedure

## 2024-02-05 ENCOUNTER — Other Ambulatory Visit: Payer: Self-pay | Admitting: Physician Assistant

## 2024-02-05 ENCOUNTER — Inpatient Hospital Stay (HOSPITAL_COMMUNITY)
Admission: RE | Admit: 2024-02-05 | Discharge: 2024-02-06 | DRG: 266 | Disposition: A | Discharge status: Home / Self Care | Attending: Cardiovascular Disease | Admitting: Cardiovascular Disease

## 2024-02-05 ENCOUNTER — Other Ambulatory Visit: Payer: Self-pay

## 2024-02-05 ENCOUNTER — Inpatient Hospital Stay (HOSPITAL_COMMUNITY): Admitting: Physician Assistant

## 2024-02-05 ENCOUNTER — Inpatient Hospital Stay (HOSPITAL_COMMUNITY)

## 2024-02-05 ENCOUNTER — Encounter (HOSPITAL_COMMUNITY): Payer: Self-pay | Admitting: Cardiovascular Disease

## 2024-02-05 ENCOUNTER — Inpatient Hospital Stay (HOSPITAL_COMMUNITY): Admitting: Certified Registered Nurse Anesthetist

## 2024-02-05 ENCOUNTER — Encounter (HOSPITAL_COMMUNITY)
Admission: RE | Disposition: A | Discharge status: Home / Self Care | Payer: Self-pay | Source: Home / Self Care | Attending: Cardiovascular Disease

## 2024-02-05 DIAGNOSIS — Z955 Presence of coronary angioplasty implant and graft: Secondary | ICD-10-CM | POA: Diagnosis not present

## 2024-02-05 DIAGNOSIS — I2510 Atherosclerotic heart disease of native coronary artery without angina pectoris: Secondary | ICD-10-CM | POA: Diagnosis present

## 2024-02-05 DIAGNOSIS — I110 Hypertensive heart disease with heart failure: Secondary | ICD-10-CM | POA: Diagnosis present

## 2024-02-05 DIAGNOSIS — Z006 Encounter for examination for normal comparison and control in clinical research program: Secondary | ICD-10-CM

## 2024-02-05 DIAGNOSIS — E1151 Type 2 diabetes mellitus with diabetic peripheral angiopathy without gangrene: Secondary | ICD-10-CM | POA: Diagnosis present

## 2024-02-05 DIAGNOSIS — I350 Nonrheumatic aortic (valve) stenosis: Secondary | ICD-10-CM | POA: Diagnosis present

## 2024-02-05 DIAGNOSIS — I440 Atrioventricular block, first degree: Secondary | ICD-10-CM | POA: Diagnosis present

## 2024-02-05 DIAGNOSIS — Z7982 Long term (current) use of aspirin: Secondary | ICD-10-CM

## 2024-02-05 DIAGNOSIS — Z952 Presence of prosthetic heart valve: Secondary | ICD-10-CM

## 2024-02-05 DIAGNOSIS — Z888 Allergy status to other drugs, medicaments and biological substances status: Secondary | ICD-10-CM | POA: Diagnosis not present

## 2024-02-05 DIAGNOSIS — Z87891 Personal history of nicotine dependence: Secondary | ICD-10-CM

## 2024-02-05 DIAGNOSIS — I252 Old myocardial infarction: Secondary | ICD-10-CM | POA: Diagnosis not present

## 2024-02-05 DIAGNOSIS — Z79899 Other long term (current) drug therapy: Secondary | ICD-10-CM | POA: Diagnosis not present

## 2024-02-05 DIAGNOSIS — E785 Hyperlipidemia, unspecified: Secondary | ICD-10-CM | POA: Diagnosis present

## 2024-02-05 DIAGNOSIS — R911 Solitary pulmonary nodule: Secondary | ICD-10-CM | POA: Diagnosis present

## 2024-02-05 DIAGNOSIS — K219 Gastro-esophageal reflux disease without esophagitis: Secondary | ICD-10-CM

## 2024-02-05 DIAGNOSIS — I5033 Acute on chronic diastolic (congestive) heart failure: Secondary | ICD-10-CM | POA: Diagnosis present

## 2024-02-05 DIAGNOSIS — I5031 Acute diastolic (congestive) heart failure: Secondary | ICD-10-CM | POA: Diagnosis present

## 2024-02-05 DIAGNOSIS — I10 Essential (primary) hypertension: Secondary | ICD-10-CM

## 2024-02-05 DIAGNOSIS — E119 Type 2 diabetes mellitus without complications: Secondary | ICD-10-CM

## 2024-02-05 DIAGNOSIS — Z8249 Family history of ischemic heart disease and other diseases of the circulatory system: Secondary | ICD-10-CM

## 2024-02-05 DIAGNOSIS — F17210 Nicotine dependence, cigarettes, uncomplicated: Secondary | ICD-10-CM | POA: Diagnosis not present

## 2024-02-05 DIAGNOSIS — J449 Chronic obstructive pulmonary disease, unspecified: Secondary | ICD-10-CM | POA: Diagnosis present

## 2024-02-05 DIAGNOSIS — Z7984 Long term (current) use of oral hypoglycemic drugs: Secondary | ICD-10-CM

## 2024-02-05 HISTORY — DX: Nonrheumatic aortic (valve) stenosis: I35.0

## 2024-02-05 HISTORY — DX: Presence of prosthetic heart valve: Z95.2

## 2024-02-05 HISTORY — DX: Atherosclerotic heart disease of native coronary artery without angina pectoris: I25.10

## 2024-02-05 HISTORY — DX: Essential (primary) hypertension: I10

## 2024-02-05 HISTORY — PX: INTRAOPERATIVE TRANSTHORACIC ECHOCARDIOGRAM: SHX6523

## 2024-02-05 LAB — POCT I-STAT, CHEM 8
BUN: 10 mg/dL (ref 8–23)
BUN: 10 mg/dL (ref 8–23)
Calcium, Ion: 1.17 mmol/L (ref 1.15–1.40)
Calcium, Ion: 1.2 mmol/L (ref 1.15–1.40)
Chloride: 109 mmol/L (ref 98–111)
Chloride: 110 mmol/L (ref 98–111)
Creatinine, Ser: 1.1 mg/dL (ref 0.61–1.24)
Creatinine, Ser: 1.1 mg/dL (ref 0.61–1.24)
Glucose, Bld: 164 mg/dL — ABNORMAL HIGH (ref 70–99)
Glucose, Bld: 168 mg/dL — ABNORMAL HIGH (ref 70–99)
HCT: 34 % — ABNORMAL LOW (ref 39.0–52.0)
HCT: 34 % — ABNORMAL LOW (ref 39.0–52.0)
Hemoglobin: 11.6 g/dL — ABNORMAL LOW (ref 13.0–17.0)
Hemoglobin: 11.6 g/dL — ABNORMAL LOW (ref 13.0–17.0)
Potassium: 3.9 mmol/L (ref 3.5–5.1)
Potassium: 3.9 mmol/L (ref 3.5–5.1)
Sodium: 143 mmol/L (ref 135–145)
Sodium: 143 mmol/L (ref 135–145)
TCO2: 24 mmol/L (ref 22–32)
TCO2: 25 mmol/L (ref 22–32)

## 2024-02-05 LAB — ECHOCARDIOGRAM LIMITED
AR max vel: 1.72 cm2
AV Area VTI: 1.9 cm2
AV Area mean vel: 1.71 cm2
AV Mean grad: 3 mmHg
AV Peak grad: 5.4 mmHg
Ao pk vel: 1.16 m/s

## 2024-02-05 LAB — GLUCOSE, CAPILLARY
Glucose-Capillary: 136 mg/dL — ABNORMAL HIGH (ref 70–99)
Glucose-Capillary: 116 mg/dL — ABNORMAL HIGH (ref 70–99)
Glucose-Capillary: 176 mg/dL — ABNORMAL HIGH (ref 70–99)
Glucose-Capillary: 155 mg/dL — ABNORMAL HIGH (ref 70–99)

## 2024-02-05 SURGERY — TRANSCATHETER AORTIC VALVE REPLACEMENT, TRANSFEMORAL (CATHLAB)
Anesthesia: Monitor Anesthesia Care

## 2024-02-05 MED ORDER — LACTATED RINGERS IV SOLN: INTRAVENOUS | Status: DC | PRN

## 2024-02-05 MED ORDER — SODIUM CHLORIDE 0.9 % IV SOLN: 250.0000 mL | INTRAVENOUS | Status: DC | PRN

## 2024-02-05 MED ORDER — SODIUM CHLORIDE 0.9% FLUSH
3.0000 mL | Freq: Two times a day (BID) | INTRAVENOUS | Status: DC
  Administered 2024-02-06: 3 mL via INTRAVENOUS

## 2024-02-05 MED ORDER — TRAMADOL HCL 50 MG PO TABS: 50.0000 mg | ORAL_TABLET | ORAL | Status: DC | PRN

## 2024-02-05 MED ORDER — HEPARIN SODIUM (PORCINE) 1000 UNIT/ML IJ SOLN
INTRAMUSCULAR | Status: DC | PRN
  Administered 2024-02-05: 12000 [IU] via INTRAVENOUS

## 2024-02-05 MED ORDER — ACETAMINOPHEN 650 MG RE SUPP: 650.0000 mg | Freq: Four times a day (QID) | RECTAL | Status: DC | PRN

## 2024-02-05 MED ORDER — CHLORHEXIDINE GLUCONATE 4 % EX SOLN: 60.0000 mL | Freq: Once | CUTANEOUS | Status: DC

## 2024-02-05 MED ORDER — MOMETASONE FURO-FORMOTEROL FUM 200-5 MCG/ACT IN AERO
2.0000 | INHALATION_SPRAY | Freq: Two times a day (BID) | RESPIRATORY_TRACT | Status: DC
  Administered 2024-02-06: 2 via RESPIRATORY_TRACT
  Filled 2024-02-05: qty 8.8

## 2024-02-05 MED ORDER — HEPARIN (PORCINE) IN NACL 1000-0.9 UT/500ML-% IV SOLN
INTRAVENOUS | Status: DC | PRN
  Administered 2024-02-05: 1000 mL

## 2024-02-05 MED ORDER — PROPOFOL 500 MG/50ML IV EMUL
INTRAVENOUS | Status: DC | PRN
  Administered 2024-02-05: 20 ug/kg/min via INTRAVENOUS

## 2024-02-05 MED ORDER — SODIUM CHLORIDE 0.9 % IV SOLN: INTRAVENOUS | Status: AC

## 2024-02-05 MED ORDER — LORATADINE 10 MG PO TABS
10.0000 mg | ORAL_TABLET | Freq: Every day | ORAL | Status: DC
  Administered 2024-02-05 – 2024-02-06 (×2): 10 mg via ORAL
  Filled 2024-02-05 (×2): qty 1

## 2024-02-05 MED ORDER — NITROGLYCERIN IN D5W 200-5 MCG/ML-% IV SOLN: 0.0000 ug/min | INTRAVENOUS | Status: DC

## 2024-02-05 MED ORDER — OXYCODONE HCL 5 MG PO TABS: 5.0000 mg | ORAL_TABLET | ORAL | Status: DC | PRN

## 2024-02-05 MED ORDER — POTASSIUM CHLORIDE CRYS ER 10 MEQ PO TBCR
10.0000 meq | EXTENDED_RELEASE_TABLET | Freq: Once | ORAL | Status: AC
  Administered 2024-02-05: 10 meq via ORAL
  Filled 2024-02-05: qty 1

## 2024-02-05 MED ORDER — CHLORHEXIDINE GLUCONATE 4 % EX SOLN: 30.0000 mL | CUTANEOUS | Status: DC

## 2024-02-05 MED ORDER — SODIUM CHLORIDE 0.9 % IV SOLN: INTRAVENOUS | Status: DC

## 2024-02-05 MED ORDER — PROPOFOL 10 MG/ML IV BOLUS
INTRAVENOUS | Status: DC | PRN
  Administered 2024-02-05: 20 mg via INTRAVENOUS

## 2024-02-05 MED ORDER — CEFAZOLIN SODIUM-DEXTROSE 2-4 GM/100ML-% IV SOLN
2.0000 g | Freq: Three times a day (TID) | INTRAVENOUS | Status: AC
  Administered 2024-02-05 (×2): 2 g via INTRAVENOUS
  Filled 2024-02-05 (×2): qty 100

## 2024-02-05 MED ORDER — FUROSEMIDE 10 MG/ML IJ SOLN
20.0000 mg | Freq: Once | INTRAMUSCULAR | Status: AC
  Administered 2024-02-05: 20 mg via INTRAVENOUS
  Filled 2024-02-05: qty 2

## 2024-02-05 MED ORDER — LIDOCAINE HCL (PF) 1 % IJ SOLN
INTRAMUSCULAR | Status: DC | PRN
  Administered 2024-02-05: 5 mL

## 2024-02-05 MED ORDER — IODIXANOL 320 MG/ML IV SOLN
INTRAVENOUS | Status: DC | PRN
  Administered 2024-02-05: 40 mL

## 2024-02-05 MED ORDER — INSULIN ASPART 100 UNIT/ML IJ SOLN
0.0000 [IU] | Freq: Three times a day (TID) | INTRAMUSCULAR | Status: DC
  Administered 2024-02-05: 2 [IU] via SUBCUTANEOUS
  Administered 2024-02-05: 4 [IU] via SUBCUTANEOUS

## 2024-02-05 MED ORDER — ONDANSETRON HCL 4 MG/2ML IJ SOLN: 4.0000 mg | Freq: Four times a day (QID) | INTRAMUSCULAR | Status: DC | PRN

## 2024-02-05 MED ORDER — ONDANSETRON HCL 4 MG/2ML IJ SOLN
INTRAMUSCULAR | Status: DC | PRN
  Administered 2024-02-05: 4 mg via INTRAVENOUS

## 2024-02-05 MED ORDER — FLUTICASONE PROPIONATE 50 MCG/ACT NA SUSP
2.0000 | Freq: Every day | NASAL | Status: DC
  Administered 2024-02-05 – 2024-02-06 (×2): 2 via NASAL
  Filled 2024-02-05: qty 16

## 2024-02-05 MED ORDER — PROTAMINE SULFATE 10 MG/ML IV SOLN
INTRAVENOUS | Status: DC | PRN
  Administered 2024-02-05: 120 mg via INTRAVENOUS

## 2024-02-05 MED ORDER — LIDOCAINE HCL (PF) 1 % IJ SOLN
INTRAMUSCULAR | Status: AC
  Filled 2024-02-05: qty 30

## 2024-02-05 MED ORDER — ALLOPURINOL 100 MG PO TABS
100.0000 mg | ORAL_TABLET | Freq: Every morning | ORAL | Status: DC
  Administered 2024-02-06: 100 mg via ORAL
  Filled 2024-02-05: qty 1

## 2024-02-05 MED ORDER — MORPHINE SULFATE (PF) 2 MG/ML IV SOLN: 1.0000 mg | INTRAVENOUS | Status: DC | PRN

## 2024-02-05 MED ORDER — CHLORHEXIDINE GLUCONATE 0.12 % MT SOLN
15.0000 mL | Freq: Once | OROMUCOSAL | Status: AC
  Administered 2024-02-05: 15 mL via OROMUCOSAL
  Filled 2024-02-05: qty 15

## 2024-02-05 MED ORDER — ASPIRIN 81 MG PO TBEC
81.0000 mg | DELAYED_RELEASE_TABLET | Freq: Every morning | ORAL | Status: DC
  Administered 2024-02-06: 81 mg via ORAL
  Filled 2024-02-05: qty 1

## 2024-02-05 MED ORDER — ACETAMINOPHEN 325 MG PO TABS: 650.0000 mg | ORAL_TABLET | Freq: Four times a day (QID) | ORAL | Status: DC | PRN

## 2024-02-05 MED ORDER — ROSUVASTATIN CALCIUM 20 MG PO TABS
20.0000 mg | ORAL_TABLET | Freq: Every morning | ORAL | Status: DC
Start: 2024-02-06 — End: 2024-02-06
  Administered 2024-02-06: 20 mg via ORAL
  Filled 2024-02-05: qty 1

## 2024-02-05 MED ORDER — IPRATROPIUM BROMIDE 0.06 % NA SOLN
2.0000 | Freq: Three times a day (TID) | NASAL | Status: DC
  Administered 2024-02-05 – 2024-02-06 (×2): 2 via NASAL
  Filled 2024-02-05: qty 15

## 2024-02-05 MED ORDER — SODIUM CHLORIDE 0.9% FLUSH: 3.0000 mL | INTRAVENOUS | Status: DC | PRN

## 2024-02-05 SURGICAL SUPPLY — 30 items
BAG SNAP BAND KOVER 36X36 (MISCELLANEOUS) ×4 IMPLANT
CABLE ADAPT PACING TEMP 12FT (ADAPTER) IMPLANT
CATH 23 ULTRA DELIVERY (CATHETERS) IMPLANT
CATH DIAG 6FR PIGTAIL ANGLED (CATHETERS) IMPLANT
CATH INFINITI 5FR ANG PIGTAIL (CATHETERS) IMPLANT
CATH INFINITI 6F AL2 (CATHETERS) IMPLANT
CATH S G BIP PACING (CATHETERS) IMPLANT
CLOSURE MYNX CONTROL 6F/7F (Vascular Products) IMPLANT
CLOSURE PERCLOSE PROSTYLE (VASCULAR PRODUCTS) IMPLANT
CRIMPER (MISCELLANEOUS) IMPLANT
DEVICE INFLATION ATRION QL2530 (MISCELLANEOUS) IMPLANT
GUIDEWIRE SAFE TJ AMPLATZ EXST (WIRE) IMPLANT
HEMOSTAT KELLY 5.5 SS STRL (MISCELLANEOUS) IMPLANT
KIT ENCORE 26 ADVANTAGE (KITS) IMPLANT
KIT MICROPUNCTURE NIT STIFF (SHEATH) IMPLANT
KIT SAPIAN 3 ULTRA RESILIA 23 (Valve) IMPLANT
KIT SINGLE USE MANIFOLD (KITS) IMPLANT
PACK CARDIAC CATHETERIZATION (CUSTOM PROCEDURE TRAY) ×2 IMPLANT
SET ATX-X65L (MISCELLANEOUS) IMPLANT
SHEATH BRITE TIP 7FR 35CM (SHEATH) IMPLANT
SHEATH INTRODUCER SET 20-26 (SHEATH) IMPLANT
SHEATH PINNACLE 6F 10CM (SHEATH) IMPLANT
SHEATH PINNACLE 8F 10CM (SHEATH) IMPLANT
STOPCOCK MORSE 400PSI 3WAY (MISCELLANEOUS) ×4 IMPLANT
TUBING ART PRESS 72 MALE/FEM (TUBING) IMPLANT
WIRE AMPLATZ SS-J .035X180CM (WIRE) IMPLANT
WIRE EMERALD 3MM-J .035X150CM (WIRE) IMPLANT
WIRE EMERALD 3MM-J .035X260CM (WIRE) IMPLANT
WIRE EMERALD ST .035X260CM (WIRE) IMPLANT
WIRE TORQFLEX AUST .018X40CM (WIRE) IMPLANT

## 2024-02-05 NOTE — Discharge Summary (Incomplete)
 HEART AND VASCULAR CENTER   MULTIDISCIPLINARY HEART VALVE TEAM  Discharge Summary    Patient ID: [name]. MRN: [id number]; DOB: [birth year]  Admit date: 02/05/2024 Discharge date: 02/06/2024  [facility] Provider: [facility] [place] [facility]  Primary Cardiologist: Lorelle Gibbs PA [facility]) / Dr. Clifton James & Dr. Leafy Ro (TAVR)  Discharge Diagnoses    Principal Problem:   S/P TAVR (transcatheter aortic valve replacement) Active Problems:   Hyperlipidemia   Hypertension   CAD (coronary artery disease)   COPD (chronic obstructive pulmonary disease) (HCC)   Diabetes mellitus without complication (HCC)   Severe aortic stenosis   GERD (gastroesophageal reflux disease)   Acute on chronic heart failure with preserved ejection fraction (HFpEF) (HCC)   Allergies Allergies  Allergen Reactions   Ace Inhibitors Cough   Lisinopril Cough    Diagnostic Studies/Procedures    TAVR OPERATIVE NOTE     Date of Procedure:                02/05/2024   Preoperative Diagnosis:      Severe Aortic Stenosis    Postoperative Diagnosis:    Same    Procedure:        Transcatheter Aortic Valve Replacement - Transfemoral Approach             Edwards Sapien 3 THV (size 23 mm, model # [id number], serial # [id number] )              Co-Surgeons:                        Verne Carrow, MD and Eugenio Hoes , MD    Anesthesiologist:                  Maple Hudson   Echocardiographer:              Croitoru   Pre-operative Echo Findings: Severe aortic stenosis Normal left ventricular systolic function   Post-operative Echo Findings: No paravalvular leak Normal left ventricular systolic function _____________    Echo 02/06/24: completed but pending formal read at the time of discharge   History of Present Illness     [name]. is a 78 y.o. male with a history of HTN, HLD, DMT2, PAD, CAD s/p PCI to LCx in 2005, COPD with prior tobacco abuse and severe aortic stenosis who presented to [facility] on 02/05/24  for planned TAVR.   Echo 09/2023 at the [facility] in [place], [place] with EF 60%, mean grad 49 mmHg, AVA 0.4 cm2 as well as mod AI and mild MR. Cardiac cath 10/31/23 at the [facility] in [place], [place] showed diffuse disease in the Circumflex artery (50% proximal, 90% distal, 50% ostial disease in the obtuse marginal branches), 50% proximal LAD stenosis, small non-dominant RCA with 70% ostial stenosis. Dr. Clifton James personally reviewed the echo and cath films. He felt the RCA was too small for PCI and the distal AV groove Circumflex had severe stenosis but this vessel was small, and he did not think PCI was indicated given the absence of angina. Due to poor dentition, he underwent multiple extractions.   The patient was evaluated by the multidisciplinary valve team and felt to have severe, symptomatic aortic stenosis and to be a suitable candidate for TAVR, which was set up for 02/05/24.   Hospital Course     Consultants: none   Severe AS:  -- S/p successful TAVR with a 23 mm Edwards Sapien 3 Ultra Resilia THV via  the TF approach on 02/05/24.  -- Post operative echo completed but pending formal read. -- Groin sites are stable.  -- ECG with sinus and no high grade heart block. -- Continued on home Asprin 81mg  daily. -- Met with cardiac rehab to discuss CRP phase II.  -- Plan for discharge home today with close follow up in the outpatient setting.   Acute HFpeEF: -- LVEDP 20 mm hg at the time of TAVR.  -- Treated with IV lasix 20mg  and Kdur x 1.  -- Appears euvolemic today.   CAD:  -- Cardiac cath 10/31/23 at the [facility] in [place], [place] showed diffuse disease in the Circumflex artery (50% proximal, 90% distal, 50% ostial disease in the obtuse marginal branches), 50% proximal LAD stenosis, small non-dominant RCA with 70% ostial stenosis.  -- Dr. Clifton James personally reviewed cath films. He felt the RCA was too small for PCI and the distal AV groove Circumflex had severe stenosis but this vessel was small, and  he did not think PCI was indicated given the absence of angina. -- Continue medical therapy with aspirin and statin.   HTN: -- BP well controlled.  -- Resume home amlodipine 5mg  daily, bisoprolol 5mg  daily and losartan 100mg  daily.   DMT2: -- Treated with SSI while admitted.  -- Resume home meds at discharge.  -- Okay to resume Metformin after 48 hours after contrast dye exposure (3/21AM)   HLD: -- Continue Crestor 20mg  daily.   Pulmonary nodule: -- 2 mm solid pulmonary nodule within the RUL noted on pre TAVR CTs.  -- He will need follow up given smoking history and COPD. -- Will discuss in the outpatient setting.  _____________  Discharge Vitals Blood pressure 105/83, pulse 79, temperature 97.7 F (36.5 C), temperature source Oral, resp. rate 15, height 5\' 5"  (1.651 m), weight 79.2 kg, SpO2 95%.  Filed Weights   02/05/24 0905 02/06/24 0328 02/06/24 0336  Weight: 82.6 kg 84.2 kg 79.2 kg    GEN: Well nourished, well developed, in no acute distress HEENT: normal Neck: no JVD or masses Cardiac: RRR; no murmurs, rubs, or gallops,no edema  Respiratory:  clear to auscultation bilaterally, normal work of breathing GI: soft, nontender, nondistended, + BS MS: no deformity or atrophy Skin: warm and dry, no rash Neuro:  Alert and Oriented x 3, Strength and sensation are intact Psych: euthymic mood, full affect   Disposition   Pt is being discharged home today in good condition.  Follow-up Plans & Appointments     Follow-up Information     [name], MD. Go on 02/14/2024.   Specialty: Cardiology Why: @ 1:40pm, please arrive at least 15 minutes early. Contact information: [address] [place] [address] [phone]                  Discharge Medications   Allergies as of 02/06/2024       Reactions   Ace Inhibitors Cough   Lisinopril Cough        Medication List     PAUSE taking these medications    metFORMIN 500 MG  tablet Wait to take this until: February 08, 2024 Morning Commonly known as: GLUCOPHAGE Take 1,000 mg by mouth in the morning.       TAKE these medications    allopurinol 100 MG tablet Commonly known as: ZYLOPRIM Take 100 mg by mouth in the morning.   amLODipine 5 MG tablet Commonly known as: NORVASC Take 1 tablet (5  mg total) by mouth daily.   aspirin EC 81 MG tablet Take 81 mg by mouth in the morning.   bisoprolol 5 MG tablet Commonly known as: ZEBETA Take 5 mg by mouth in the morning.   cetirizine 10 MG tablet Commonly known as: ZYRTEC Take 10 mg by mouth at bedtime.   clobetasol ointment 0.05 % Commonly known as: TEMOVATE Apply 1 Application topically daily as needed (back).   [employer]S ANTIHISTAMINE EYE DROPS OP Place 1 drop into both eyes daily as needed (eye irritation/allergies.).   cyanocobalamin 500 MCG tablet Commonly known as: VITAMIN B12 Take 1,000 mcg by mouth in the morning.   desonide 0.05 % cream Commonly known as: DESOWEN Apply 1 Application topically daily as needed (rash on face).   diclofenac Sodium 1 % Gel Commonly known as: VOLTAREN Apply 1 Application topically 4 (four) times daily as needed (pain.).   fluocinonide 0.05 % external solution Commonly known as: LIDEX Apply 1 Application topically 3 (three) times daily as needed (scalp).   fluticasone 50 MCG/ACT nasal spray Commonly known as: FLONASE Place 2 sprays into both nostrils in the morning and at bedtime.   hydrocortisone 2.5 % cream Apply 1 Application topically daily as needed (dry skin on face).   ipratropium 0.06 % nasal spray Commonly known as: ATROVENT Place 2 sprays into both nostrils in the morning and at bedtime.   losartan 100 MG tablet Commonly known as: COZAAR Take 100 mg by mouth in the morning.   rosuvastatin 20 MG tablet Commonly known as: CRESTOR Take 20 mg by mouth in the morning.   Theratears 0.25 % Soln Generic drug: Carboxymethylcellulose Sodium Place 1  drop into both eyes in the morning and at bedtime.   tiotropium 18 MCG inhalation capsule Commonly known as: SPIRIVA Place 18 mcg into inhaler and inhale daily as needed (respiratory issues.).   Wixela Inhub 250-50 MCG/ACT Aepb Generic drug: fluticasone-salmeterol Inhale 1 puff into the lungs in the morning and at bedtime.          Outstanding Labs/Studies   none _____________________  Duration of Discharge Encounter: APP Time: 20 minutes    Signed, Cline Crock, PA-C 02/06/2024, 9:36 AM [phone]   I have personally seen and examined this patient. I agree with the assessment and plan as outlined above.  Pt with severe AS. He is doing well one day post TAVR with placement of a 23 mm Edwards Sapien Ultra Resilia THV from the transfemoral approach.  No complaints today Labs reviewed EKG reviewed.  Echo images reviewed by me My exam: NAD  CV:RRR, no murmur  Lungs: clear bilaterally  Ext: no LE edema. Groins stable without hematoma  Plan: Will discharge home today on ASA. Follow up in our office in one week.    I have spent 25 minutes on this discharge day reviewing the chart, notes, labs, EKG, patient examination and plan formulation in this note.   Verne Carrow, MD, [facility] 02/06/2024 10:21 AM

## 2024-02-05 NOTE — Anesthesia Procedure Notes (Signed)
 Procedure Name: MAC Date/Time: 02/05/2024 11:06 AM  Performed by: Randon Goldsmith, CRNAPre-anesthesia Checklist: Patient identified, Emergency Drugs available, Suction available and Patient being monitored Patient Re-evaluated:Patient Re-evaluated prior to induction Oxygen Delivery Method: Simple face mask Preoxygenation: Pre-oxygenation with 100% oxygen

## 2024-02-05 NOTE — Progress Notes (Signed)
  Echocardiogram 2D Echocardiogram has been performed.  [name] 02/05/2024, 12:53 PM

## 2024-02-05 NOTE — Progress Notes (Signed)
  HEART AND VASCULAR CENTER   MULTIDISCIPLINARY HEART VALVE TEAM  Patient doing well s/p TAVR. He is hemodynamically stable. Groin sites stable. ECG with new 1st deg AV block but no high grade block. Transferred from cath lab holding to 4E. LVEDP 20 mm hg at the time of TAVR. Will treat with one dose of IV lasix 20mg  and Kdur 10 meq. Early ambulation after bedrest completed and hopeful discharge over the next 24-48 hours.   Cline Crock PA-C  [facility]  Pager [phone]

## 2024-02-05 NOTE — Op Note (Signed)
 HEART AND VASCULAR CENTER   MULTIDISCIPLINARY HEART VALVE TEAM   TAVR OPERATIVE NOTE   Date of Procedure:  02/05/2024  Preoperative Diagnosis: Severe Aortic Stenosis   Postoperative Diagnosis: Same   Procedure:   Transcatheter Aortic Valve Replacement - Percutaneous right Transfemoral Approach  Edwards Sapien 3 Ultra THV (size 23 mm, model # [id number])   Co-Surgeons:  Eugenio Hoes MD and  Verne Carrow   Anesthesiologist:  Dr Sol Passer  Echocardiographer:  Dr Croitoru  Pre-operative Echo Findings: Severe aortic stenosis normal left ventricular systolic function  Post-operative Echo Findings: no paravalvular leak normal left ventricular systolic function   BRIEF CLINICAL NOTE AND INDICATIONS FOR SURGERY  78 yo male with NYHA class 1 symptoms of severe AS with normal LV function and moderate CAD. Pt would best be served with AVR and due to his advanced age would benefit from TAVR. He has aortoiliac disease but should be doable with femoral access with a Sapien 23mm valve     DETAILS OF THE OPERATIVE PROCEDURE  PREPARATION:    The patient was brought to the operating room on the above mentioned date and appropriate monitoring was established by the anesthesia team. The patient was placed in the supine position on the operating table.  Intravenous antibiotics were administered. The patient was monitored closely throughout the procedure under conscious sedation.    Baseline transthoracic echocardiogram was performed. The patient's abdomen and both groins were prepped and draped in a sterile manner. A time out procedure was performed.   PERIPHERAL ACCESS:    Using the modified Seldinger technique, femoral arterial and venous access was obtained with placement of 6 Fr sheaths on the left side.  A pigtail diagnostic catheter was passed through the left arterial sheath under fluoroscopic guidance into the aortic root.  A temporary transvenous pacemaker catheter was passed  through the left femoral venous sheath under fluoroscopic guidance into the right ventricle.  The pacemaker was tested to ensure stable lead placement and pacemaker capture. Aortic root angiography was performed in order to determine the optimal angiographic angle for valve deployment.   TRANSFEMORAL ACCESS:   Percutaneous transfemoral access and sheath placement was performed using ultrasound guidance.  The right common femoral artery was cannulated using a micropuncture needle and appropriate location was verified using hand injection angiogram.  A pair of Abbott Perclose percutaneous closure devices were placed and a 6 French sheath replaced into the femoral artery.  The patient was heparinized systemically and ACT verified > 250 seconds.    A 14 Fr transfemoral E-sheath was introduced into the right common femoral artery after progressively dilating over an Amplatz superstiff wire. An AL2 catheter was used to direct a straight-tip exchange length wire across the native aortic valve into the left ventricle. This was exchanged out for a pigtail catheter and position was confirmed in the LV apex. Simultaneous LV and Ao pressures were recorded.  The pigtail catheter was exchanged for a Safari wire in the LV apex.   BALLOON AORTIC VALVULOPLASTY:   Was not performed   TRANSCATHETER HEART VALVE DEPLOYMENT:   An Edwards Sapien 3 Ultra transcatheter heart valve (size 23 mm) was prepared and crimped per manufacturer's guidelines, and the proper orientation of the valve is confirmed on the [employer] delivery system. The valve was advanced through the introducer sheath using normal technique until in an appropriate position in the abdominal aorta beyond the sheath tip. The balloon was then retracted and using the fine-tuning wheel was centered on the valve.  The valve was then advanced across the aortic arch using appropriate flexion of the catheter. The valve was carefully positioned across the  aortic valve annulus. The Commander catheter was retracted using normal technique. Once final position of the valve has been confirmed by angiographic assessment, the valve is deployed during rapid ventricular pacing to maintain systolic blood pressure < 50 mmHg and pulse pressure < 10 mmHg. The balloon inflation is held for >3 seconds after reaching full deployment volume. Once the balloon has fully deflated the balloon is retracted into the ascending aorta and valve function is assessed using echocardiography. There is felt to be no paravalvular leak and no central aortic insufficiency.  The patient's hemodynamic recovery following valve deployment is good.  The deployment balloon and guidewire are both removed.    PROCEDURE COMPLETION:   The sheath was removed and femoral artery closure performed.  Protamine was administered once femoral arterial repair was complete. The temporary pacemaker, pigtail catheter and femoral sheaths were removed with manual pressure used for venous hemostasis.  A Mynx femoral closure device was utilized following removal of the diagnostic sheath in the left femoral artery.  The patient tolerated the procedure well and is transported to the cath lab recovery area in stable condition. There were no immediate intraoperative complications. All sponge instrument and needle counts are verified correct at completion of the operation.   No blood products were administered during the operation.  The patient received a total of 40 mL of intravenous contrast during the procedure.   Eugenio Hoes, MD 02/05/2024 12:24 PM

## 2024-02-05 NOTE — CV Procedure (Signed)
 HEART AND VASCULAR CENTER  TAVR OPERATIVE NOTE   Date of Procedure:  02/05/2024  Preoperative Diagnosis: Severe Aortic Stenosis   Postoperative Diagnosis: Same   Procedure:   Transcatheter Aortic Valve Replacement - Transfemoral Approach  Edwards Sapien 3 THV (size 23 mm, model # [id number], serial # [id number] )   Co-Surgeons:  Verne Carrow, MD and Eugenio Hoes , MD   Anesthesiologist:  Maple Hudson  Echocardiographer:  Croitoru  Pre-operative Echo Findings: Severe aortic stenosis Normal left ventricular systolic function  Post-operative Echo Findings: No paravalvular leak Normal left ventricular systolic function  BRIEF CLINICAL NOTE AND INDICATIONS FOR SURGERY  78 yo male with history of HTN, HLD, DM, PAD, CAD with prior stenting of the Circumflex artery in 2005, COPD, prior tobacco abuse and severe aortic stenosis. Echo November 2024 at the [place] in [place], [place] with LVEF=60-65%. Severe aortic stenosis with mean gradient 49 mmHg, AVA 0.4 cm2, DI 0.13. Moderate aortic valve insufficiency. Mild MR. I was able to load the echo images into our viewing system and reviewed the images. The aortic valve leaflets are thickened and calcified and do not open well. Cardiac cath 10/31/23 at the [facility] in [place], [place] with diffuse disease in the Circumflex artery (50% proximal, 90% distal, 50% ostial disease in the obtuse marginal branches), 50% proximal LAD stenosis, small non-dominant RCA with 70% ostial stenosis. I personally reviewed the cath films. The RCA is too small for PCI. The Circumflex is diffusely diseased. The distal AV groove Circumflex has severe stenosis but this vessel is small and I didn't think PCI is indicated in this vessel in the absence of angina. He has dizziness.   During the course of the patient's preoperative work up they have been evaluated comprehensively by a multidisciplinary team of specialists coordinated through the Multidisciplinary Heart Valve Clinic in the  [facility].  They have been demonstrated to suffer from symptomatic severe aortic stenosis as noted above. The patient has been counseled extensively as to the relative risks and benefits of all options for the treatment of severe aortic stenosis including long term medical therapy, conventional surgery for aortic valve replacement, and transcatheter aortic valve replacement.  The patient has been independently evaluated by Dr. Leafy Ro with CT surgery and they are felt to be at high risk for conventional surgical aortic valve replacement. The surgeon indicated the patient would be a poor candidate for conventional surgery. Based upon review of all of the patient's preoperative diagnostic tests they are felt to be candidate for transcatheter aortic valve replacement using the transfemoral approach as an alternative to high risk conventional surgery.    Following the decision to proceed with transcatheter aortic valve replacement, a discussion has been held regarding what types of management strategies would be attempted intraoperatively in the event of life-threatening complications, including whether or not the patient would be considered a candidate for the use of cardiopulmonary bypass and/or conversion to open sternotomy for attempted surgical intervention.  The patient has been advised of a variety of complications that might develop peculiar to this approach including but not limited to risks of death, stroke, paravalvular leak, aortic dissection or other major vascular complications, aortic annulus rupture, device embolization, cardiac rupture or perforation, acute myocardial infarction, arrhythmia, heart block or bradycardia requiring permanent pacemaker placement, congestive heart failure, respiratory failure, renal failure, pneumonia, infection, other late complications related to structural valve deterioration or migration, or other complications that might ultimately cause a  temporary or permanent loss of functional  independence or other long term morbidity.  The patient provides full informed consent for the procedure as described and all questions were answered preoperatively.    DETAILS OF THE OPERATIVE PROCEDURE  PREPARATION:   The patient is brought to the operating room on the above mentioned date and central monitoring was established by the anesthesia team including placement of a radial arterial line. The patient is placed in the supine position on the operating table.  Intravenous antibiotics are administered. Conscious sedation is used.   Baseline transthoracic echocardiogram was performed. The patient's chest, abdomen, both groins, and both lower extremities are prepared and draped in a sterile manner. A time out procedure is performed.   PERIPHERAL ACCESS:   Using the modified Seldinger technique, femoral arterial and venous access were obtained with placement of a 6 Fr sheath in the artery and a 7 Fr sheath in the vein on the left side using u/s guidance.  A pigtail diagnostic catheter was passed through the femoral arterial sheath under fluoroscopic guidance into the aortic root.  A temporary transvenous pacemaker catheter was passed through the femoral venous sheath under fluoroscopic guidance into the right ventricle.  The pacemaker was tested to ensure stable lead placement and pacemaker capture. Aortic root angiography was performed in order to determine the optimal angiographic angle for valve deployment.  TRANSFEMORAL ACCESS:  A micropuncture kit was used to gain access to the right femoral artery using u/s guidance. Position confirmed with angiography. Pre-closure with double ProGlide closure devices. The patient was heparinized systemically and ACT verified > 250 seconds.    A 14 Fr transfemoral E-sheath was introduced into the right femoral artery after progressively dilating over an Amplatz superstiff wire. An AL-2 catheter was used to direct a  straight-tip exchange length wire across the native aortic valve into the left ventricle. This was exchanged out for a pigtail catheter and position was confirmed in the LV apex. Simultaneous LV and Ao pressures were recorded.  The pigtail catheter was then exchanged for a Safari wire in the LV apex.   TRANSCATHETER HEART VALVE DEPLOYMENT:  An Edwards Sapien 3 THV (size 23 mm) was prepared and crimped per manufacturer's guidelines, and the proper orientation of the valve is confirmed on the [employer] delivery system. The valve was advanced through the introducer sheath using normal technique until in an appropriate position in the abdominal aorta beyond the sheath tip. The balloon was then retracted and using the fine-tuning wheel was centered on the valve. The valve was then advanced across the aortic arch using appropriate flexion of the catheter. The valve was carefully positioned across the aortic valve annulus. The Commander catheter was retracted using normal technique. Once final position of the valve has been confirmed by angiographic assessment, the valve is deployed while temporarily holding ventilation and during rapid ventricular pacing to maintain systolic blood pressure < 50 mmHg and pulse pressure < 10 mmHg. The balloon inflation is held for >3 seconds after reaching full deployment volume. Once the balloon has fully deflated the balloon is retracted into the ascending aorta and valve function is assessed using TTE. There is felt to be no paravalvular leak and no central aortic insufficiency.  The patient's hemodynamic recovery following valve deployment is good.  The deployment balloon and guidewire are both removed. Echo demostrated acceptable post-procedural gradients, stable mitral valve function, and no AI.   PROCEDURE COMPLETION:  The sheath was then removed and closure devices were completed. Protamine was administered once femoral arterial repair  was complete. The temporary  pacemaker, pigtail catheters and femoral sheaths were removed with a Mynx closure device placed in the artery and manual pressure used for venous hemostasis.    The patient tolerated the procedure well and is transported to the surgical intensive care in stable condition. There were no immediate intraoperative complications. All sponge instrument and needle counts are verified correct at completion of the operation.   No blood products were administered during the operation.  The patient received a total of 40 mL of intravenous contrast during the procedure.  LVEDP: 20 mmHg  Verne Carrow MD, [facility] 02/05/2024 12:51 PM

## 2024-02-05 NOTE — Progress Notes (Signed)
 Patient arrived at the unit,CHG bath given, vitals taken,bilateral groin level 0,pt alert and oriented X4,CCMD notified,bilateral dorsalis pulse palpable

## 2024-02-05 NOTE — Interval H&P Note (Signed)
 History and Physical Interval Note:  02/05/2024 9:18 AM  [name].  has presented today for surgery, with the diagnosis of Severe Aortic Stenosis.  The various methods of treatment have been discussed with the patient and family. After consideration of risks, benefits and other options for treatment, the patient has consented to  Procedure(s) with comments: Transcatheter Aortic Valve Replacement, Transfemoral (N/A) - Transcatheter Aortic Valve Replacement, Transfemoral Approach Possible Intravascular Lithotripsy ECHOCARDIOGRAM, TRANSTHORACIC (N/A) as a surgical intervention.  The patient's history has been reviewed, patient examined, no change in status, stable for surgery.  I have reviewed the patient's chart and labs.  Questions were answered to the patient's satisfaction.     Eugenio Hoes

## 2024-02-05 NOTE — Transfer of Care (Signed)
 Immediate Anesthesia Transfer of Care Note  Patient: [name].  Procedure(s) Performed: Transcatheter Aortic Valve Replacement, Transfemoral ECHOCARDIOGRAM, TRANSTHORACIC  Patient Location: PACU and Cath Lab  Anesthesia Type:MAC  Level of Consciousness: awake and drowsy  Airway & Oxygen Therapy: Patient Spontanous Breathing and Patient connected to face mask oxygen  Post-op Assessment: Report given to RN and Post -op Vital signs reviewed and stable  Post vital signs: Reviewed and stable  Last Vitals:  Vitals Value Taken Time  BP 91/63 02/05/24 1235  Temp    Pulse 72 02/05/24 1238  Resp 24 02/05/24 1238  SpO2 97 % 02/05/24 1238  Vitals shown include unfiled device data.  Last Pain:  Vitals:   02/05/24 1228  TempSrc:   PainSc: 0-No pain         Complications: There were no known notable events for this encounter.

## 2024-02-05 NOTE — Anesthesia Preprocedure Evaluation (Signed)
 Anesthesia Evaluation  Patient identified by MRN, date of birth, ID band Patient awake    Reviewed: Allergy & Precautions, NPO status , Patient's Chart, lab work & pertinent test results  History of Anesthesia Complications Negative for: history of anesthetic complications  Airway Mallampati: III  TM Distance: >3 FB Neck ROM: Full    Dental  (+) Dental Advisory Given, Missing   Pulmonary neg shortness of breath, neg sleep apnea, COPD,  COPD inhaler, neg recent URI, former smoker   breath sounds clear to auscultation       Cardiovascular hypertension, Pt. on medications + CAD, + Past MI and + Cardiac Stents  + Valvular Problems/Murmurs AS  Rhythm:Regular + Systolic murmurs    Neuro/Psych negative neurological ROS  negative psych ROS   GI/Hepatic ,GERD  ,,  Endo/Other  diabetes  Lab Results      Component                Value               Date                      HGBA1C                                       04/24/2008            5.9 (NOTE)   The ADA recommends the following therapeutic goals for glycemic   control related to Hgb A1C measurement:   Goal of Therapy:   < 7.0% Hgb A1C   Action Suggested:  > 8.0% Hgb A1C   Ref:  Diabetes Care, 22, Suppl. 1, 1999   Renal/GU Lab Results      Component                Value               Date                      NA                       138                 02/01/2024                K                        4.2                 02/01/2024                CO2                      25                  02/01/2024                GLUCOSE                  116 (H)             02/01/2024                BUN  14                  02/01/2024                CREATININE               1.27 (H)            02/01/2024                CALCIUM                  8.8 (L)             02/01/2024                EGFR                     57 (L)              11/09/2023                GFRNONAA                  57 (L)              02/01/2024                Musculoskeletal negative musculoskeletal ROS (+)    Abdominal   Peds  Hematology Lab Results      Component                Value               Date                      WBC                      8.6                 02/01/2024                HGB                      13.9                02/01/2024                HCT                      42.8                02/01/2024                MCV                      85.4                02/01/2024                PLT                      251                 02/01/2024              Anesthesia Other Findings   Reproductive/Obstetrics  Anesthesia Physical Anesthesia Plan  ASA: 4  Anesthesia Plan: MAC   Post-op Pain Management: Minimal or no pain anticipated   Induction: Intravenous  PONV Risk Score and Plan: 1 and Treatment may vary due to age or medical condition  Airway Management Planned: Nasal Cannula, Natural Airway and Simple Face Mask  Additional Equipment: Arterial line  Intra-op Plan:   Post-operative Plan:   Informed Consent: I have reviewed the patients History and Physical, chart, labs and discussed the procedure including the risks, benefits and alternatives for the proposed anesthesia with the patient or authorized representative who has indicated his/her understanding and acceptance.     Dental advisory given  Plan Discussed with: CRNA  Anesthesia Plan Comments:          Anesthesia Quick Evaluation

## 2024-02-05 NOTE — Discharge Instructions (Signed)

## 2024-02-06 ENCOUNTER — Inpatient Hospital Stay (HOSPITAL_COMMUNITY)

## 2024-02-06 DIAGNOSIS — I350 Nonrheumatic aortic (valve) stenosis: Secondary | ICD-10-CM

## 2024-02-06 DIAGNOSIS — I5033 Acute on chronic diastolic (congestive) heart failure: Secondary | ICD-10-CM | POA: Diagnosis present

## 2024-02-06 DIAGNOSIS — Z952 Presence of prosthetic heart valve: Secondary | ICD-10-CM

## 2024-02-06 LAB — BASIC METABOLIC PANEL
Anion gap: 9 (ref 5–15)
BUN: 14 mg/dL (ref 8–23)
CO2: 24 mmol/L (ref 22–32)
Calcium: 8.3 mg/dL — ABNORMAL LOW (ref 8.9–10.3)
Chloride: 105 mmol/L (ref 98–111)
Creatinine, Ser: 1.31 mg/dL — ABNORMAL HIGH (ref 0.61–1.24)
GFR, Estimated: 55 mL/min — ABNORMAL LOW (ref >60)
Glucose, Bld: 128 mg/dL — ABNORMAL HIGH (ref 70–99)
Potassium: 3.8 mmol/L (ref 3.5–5.1)
Sodium: 138 mmol/L (ref 135–145)

## 2024-02-06 LAB — CBC
HCT: 37.1 % — ABNORMAL LOW (ref 39.0–52.0)
Hemoglobin: 12.3 g/dL — ABNORMAL LOW (ref 13.0–17.0)
MCH: 27.8 pg (ref 26.0–34.0)
MCHC: 33.2 g/dL (ref 30.0–36.0)
MCV: 83.7 fL (ref 80.0–100.0)
Platelets: 185 10*3/uL (ref 150–400)
RBC: 4.43 MIL/uL (ref 4.22–5.81)
RDW: 14.9 % (ref 11.5–15.5)
WBC: 9.4 10*3/uL (ref 4.0–10.5)
nRBC: 0 % (ref 0.0–0.2)

## 2024-02-06 LAB — GLUCOSE, CAPILLARY: Glucose-Capillary: 117 mg/dL — ABNORMAL HIGH (ref 70–99)

## 2024-02-06 LAB — MAGNESIUM: Magnesium: 1.7 mg/dL (ref 1.7–2.4)

## 2024-02-06 NOTE — Progress Notes (Signed)
  Echocardiogram 2D Echocardiogram has been performed.  [name] 02/06/2024, 8:47 AM

## 2024-02-06 NOTE — Progress Notes (Signed)
 Mobility Specialist Progress Note:    02/06/24 0928  Mobility  Activity Ambulated independently in room;Ambulated independently in hallway  Level of Assistance Standby assist, set-up cues, supervision of patient - no hands on  Assistive Device None  Distance Ambulated (ft) 280 ft  Activity Response Tolerated well  Mobility Referral Yes  Mobility visit 1 Mobility  Mobility Specialist Start Time (ACUTE ONLY) [id number]  Mobility Specialist Stop Time (ACUTE ONLY) 0936  Mobility Specialist Time Calculation (min) (ACUTE ONLY) 8 min   Pt received in bed, agreeable to mobility session. Ambulated in hallway with no AD, SBA for safety. Tolerated well, asx throughout. Max HR 105 bpm, SpO2 91-97% on RA throughout session. Returned pt to room, left with all needs met, call bell in reach.    Feliciana Rossetti Mobility Specialist Please contact via Special educational needs teacher or  Rehab office at [phone]

## 2024-02-06 NOTE — Progress Notes (Signed)
 CARDIAC REHAB PHASE I        Post TAVR education including site care, restrictions, risk factors, heart healthy diabetic diet, exercise guidelines and CRP2 reviewed. All questions and concerns addressed. Pt is not interested in CRP2 at this time. Plan for discharge home later today.   [id number] Woodroe Chen, RN BSN 02/06/2024 9:11 AM

## 2024-02-06 NOTE — Progress Notes (Signed)
 Patient given discharge instructions medication list and follow up appointments. IV and tele were dcd. Will discharge home as ordered. Ewell Benassi, Randall An RN

## 2024-02-06 NOTE — Progress Notes (Signed)
   02/06/24 1013  TOC Brief Assessment  Insurance and Status Reviewed  Patient has primary care physician Yes  Home environment has been reviewed home  Prior level of function: self  Prior/Current Home Services No current home services  Social Drivers of Health Review SDOH reviewed no interventions necessary  Readmission risk has been reviewed Yes  Transition of care needs no transition of care needs at this time    Pt s/p TAVR, stable for transition home today, no HH or DME needs noted. Has transportation home

## 2024-02-07 ENCOUNTER — Telehealth: Payer: Self-pay | Admitting: Physician Assistant

## 2024-02-07 LAB — ECHOCARDIOGRAM COMPLETE
AR max vel: 2.5 cm2
AV Area VTI: 2.34 cm2
AV Area mean vel: 2.38 cm2
AV Mean grad: 7 mmHg
AV Peak grad: 11.7 mmHg
Ao pk vel: 1.71 m/s
Area-P 1/2: 4.07 cm2
Calc EF: 57.1 %
Height: 65 in
S' Lateral: 3.6 cm
Single Plane A2C EF: 62.5 %
Single Plane A4C EF: 52.5 %
Weight: 2793.67 [oz_av]

## 2024-02-07 NOTE — Anesthesia Postprocedure Evaluation (Signed)
 Anesthesia Post Note  Patient: [name].  Procedure(s) Performed: Transcatheter Aortic Valve Replacement, Transfemoral ECHOCARDIOGRAM, TRANSTHORACIC     Patient location during evaluation: Cath Lab Anesthesia Type: MAC Level of consciousness: awake and alert Pain management: pain level controlled Vital Signs Assessment: post-procedure vital signs reviewed and stable Respiratory status: spontaneous breathing, nonlabored ventilation and respiratory function stable Cardiovascular status: stable and blood pressure returned to baseline Postop Assessment: no apparent nausea or vomiting Anesthetic complications: no   There were no known notable events for this encounter.                Tashari Schoenfelder

## 2024-02-07 NOTE — Telephone Encounter (Signed)
 Pt did not answer phone and VM is full.

## 2024-02-08 NOTE — Telephone Encounter (Signed)
 2nd attempt at [facility] call.  Left message on pt's voicemail to contact the office.

## 2024-02-14 ENCOUNTER — Ambulatory Visit: Attending: Cardiovascular Disease | Admitting: Cardiovascular Disease

## 2024-02-14 ENCOUNTER — Encounter: Payer: Self-pay | Admitting: Cardiovascular Disease

## 2024-02-14 VITALS — BP 128/66 | HR 70 | Ht 65.0 in | Wt 176.6 lb

## 2024-02-14 DIAGNOSIS — I350 Nonrheumatic aortic (valve) stenosis: Secondary | ICD-10-CM

## 2024-02-14 DIAGNOSIS — Z952 Presence of prosthetic heart valve: Secondary | ICD-10-CM | POA: Diagnosis not present

## 2024-02-14 DIAGNOSIS — I2510 Atherosclerotic heart disease of native coronary artery without angina pectoris: Secondary | ICD-10-CM | POA: Diagnosis not present

## 2024-02-14 NOTE — Progress Notes (Signed)
 Structural Heart Clinic Note  Chief Complaint  Patient presents with   Follow-up    S/p TAVR   History of Present Illness: 78 yo [name] with history of HTN, HLD, DM, PAD, CAD with prior stenting of the Circumflex artery in 2005, COPD, prior tobacco abuse and severe aortic stenosis now s/p TAVR who is here today for one week post TAVR follow up. Echo November 2024 at the [place] in [place], [place] with LVEF=60-65%. Severe aortic stenosis with mean gradient 49 mmHg, AVA 0.4 cm2, DI 0.13. Moderate aortic valve insufficiency. Mild MR. I was able to load the echo images into our viewing system and reviewed the images. The aortic valve leaflets are thickened and calcified and do not open well. Cardiac cath 10/31/23 at the [facility] in [place], [place] with diffuse disease in the Circumflex artery (50% proximal, 90% distal, 50% ostial disease in the obtuse marginal branches), 50% proximal LAD stenosis, small non-dominant RCA with 70% ostial stenosis. I personally reviewed the cath films. The RCA is too small for PCI. The Circumflex is diffusely diseased. The distal AV groove Circumflex has severe stenosis but this vessel is small and I don't think PCI is indicated in this vessel in the absence of angina.   He underwent TAVR on 02/05/24 with placement of a 23 mm Edwards Sapien 3 Ultra Resilia THV from the transfemoral approach. He did well following his procedure. Echo 02/06/24 with normal LV systolic function. AVR working well with no PVL.   He is here today for follow up. The patient denies any chest pain, dyspnea, palpitations, lower extremity edema, orthopnea, PND, dizziness, near syncope or syncope. He feels great.   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 Clifton James & Dr Leafy Ro   Severe aortic stenosis     Past Surgical History:  Procedure Laterality Date   CATARACT EXTRACTION, BILATERAL     CERVICAL SPINE SURGERY  2006   C6-C7   Coronary stenting  2005   HAND TENDON SURGERY     INTRAOPERATIVE TRANSTHORACIC ECHOCARDIOGRAM N/A 02/05/2024   Procedure: ECHOCARDIOGRAM, TRANSTHORACIC;  Surgeon: [name], MD;  Location: [facility];  Service: Cardiovascular;  Laterality: N/A;   KNEE ARTHROSCOPY WITH MEDIAL MENISECTOMY Left 06/23/2022   Procedure: KNEE ARTHROSCOPY WITH PARTIAL MEDIAL MENISECTOMY;  Surgeon: Yolonda Kida, MD;  Location: [facility] OR;  Service: Orthopedics;  Laterality: Left;  60   PLANTAR FASCIA SURGERY     ROTATOR CUFF REPAIR     left    Current Outpatient Medications  Medication Sig Dispense Refill   allopurinol (ZYLOPRIM) 100 MG tablet Take 100 mg by mouth in the morning.     amLODipine (NORVASC) 5 MG tablet Take 1 tablet (5 mg total) by mouth daily. 90 tablet 0   aspirin EC 81 MG tablet Take 81 mg by mouth in the morning.     bisoprolol (  ZEBETA) 5 MG tablet Take 5 mg by mouth in the morning.     Carboxymethylcellulose Sodium (THERATEARS) 0.25 % SOLN Place 1 drop into both eyes in the morning and at bedtime.     cetirizine (ZYRTEC) 10 MG tablet Take 10 mg by mouth at bedtime.     clobetasol ointment (TEMOVATE) 0.05 % Apply 1 Application topically daily as needed (back).     cyanocobalamin (VITAMIN B12) 500 MCG tablet Take 1,000 mcg by mouth in the morning.     desonide (DESOWEN) 0.05 % cream Apply 1 Application topically daily as needed (rash on face).     diclofenac Sodium (VOLTAREN) 1 % GEL Apply 1 Application topically 4 (four) times daily as  needed (pain.).     fluocinonide (LIDEX) 0.05 % external solution Apply 1 Application topically 3 (three) times daily as needed (scalp).     fluticasone (FLONASE) 50 MCG/ACT nasal spray Place 2 sprays into both nostrils in the morning and at bedtime.     fluticasone-salmeterol (WIXELA INHUB) 250-50 MCG/ACT AEPB Inhale 1 puff into the lungs in the morning and at bedtime.     hydrocortisone 2.5 % cream Apply 1 Application topically daily as needed (dry skin on face).     ipratropium (ATROVENT) 0.06 % nasal spray Place 2 sprays into both nostrils in the morning and at bedtime.     Ketotifen Fumarate ([employer] ANTIHISTAMINE EYE DROPS OP) Place 1 drop into both eyes daily as needed (eye irritation/allergies.).     losartan (COZAAR) 100 MG tablet Take 100 mg by mouth in the morning.     metFORMIN (GLUCOPHAGE) 500 MG tablet Take 1,000 mg by mouth in the morning.     rosuvastatin (CRESTOR) 20 MG tablet Take 20 mg by mouth in the morning.     tiotropium (SPIRIVA) 18 MCG inhalation capsule Place 18 mcg into inhaler and inhale daily as needed (respiratory issues.).     No current facility-administered medications for this visit.    Allergies  Allergen Reactions   Ace Inhibitors Cough   Lisinopril Cough    Social History   Socioeconomic History   Marital status: Single    Spouse name: Not on file   Number of children: Not on file   Years of education: Not on file   Highest education level: Not on file  Occupational History   Not on file  Tobacco Use   Smoking status: Former    Current packs/day: 0.00    Average packs/day: 3.0 packs/day for 50.0 years (150.0 ttl pk-yrs)    Types: Cigarettes    Start date: 12/21/1960    Quit date: 12/21/2010    Years since quitting: 13.1   Smokeless tobacco: Not on file  Vaping Use   Vaping status: Never Used  Substance and Sexual Activity   Alcohol use: No   Drug use: No   Sexual activity: Not on file  Other Topics Concern   Not on file  Social History  Narrative   Not on file   Social Drivers of Health   Financial Resource Strain: Not on file  Food Insecurity: Patient Declined (02/06/2024)   Hunger Vital Sign    Worried About Running Out of Food in the Last Year: Patient declined    Ran Out of Food in the Last Year: Patient declined  Transportation Needs: Unknown (02/06/2024)   PRAPARE - Administrator, Civil Service (Medical): Patient declined    Lack of Transportation (Non-Medical): Not on file  Physical  Activity: Not on file  Stress: Not on file  Social Connections: Unknown (02/06/2024)   Social Connection and Isolation Panel [NHANES]    Frequency of Communication with Friends and Family: Patient declined    Frequency of Social Gatherings with Friends and Family: Not on file    Attends Religious Services: Not on file    Active Member of Clubs or Organizations: Not on file    Attends Banker Meetings: Not on file    Marital Status: Not on file  Intimate Partner Violence: Unknown (02/06/2024)   Humiliation, Afraid, Rape, and Kick questionnaire    Fear of Current or Ex-Partner: Patient declined    Emotionally Abused: Not on file    Physically Abused: Not on file    Sexually Abused: Not on file    Family History  Problem Relation Age of Onset   Heart disease Other    Heart attack Mother    Dementia Father    Heart disease Brother 72    Review of Systems:  As stated in the HPI and otherwise negative.   BP 128/66   Pulse 70   Ht 5\' 5"  (1.651 m)   Wt 80.1 kg   SpO2 95%   BMI 29.39 kg/m   Physical Examination:  General: Well developed, well nourished, NAD  HEENT: OP clear, mucus membranes moist  SKIN: warm, dry. No rashes. Neuro: No focal deficits  Musculoskeletal: Muscle strength 5/5 all ext  Psychiatric: Mood and affect normal  Neck: No JVD, no carotid bruits, no thyromegaly, no lymphadenopathy.  Lungs:Clear bilaterally, no wheezes, rhonci, crackles Cardiovascular: Regular rate and rhythm.  No murmurs, gallops or rubs. Abdomen:Soft. Bowel sounds present. Non-tender.  Extremities: No lower extremity edema. Pulses are 2 + in the bilateral DP/PT.  EKG:  EKG is not ordered today. The ekg ordered today demonstrates   Recent Labs: 02/01/2024: ALT 15 02/06/2024: BUN 14; Creatinine, Ser 1.31; Hemoglobin 12.3; Magnesium 1.7; Platelets 185; Potassium 3.8; Sodium 138     Wt Readings from Last 3 Encounters:  02/14/24 80.1 kg  02/06/24 79.2 kg  12/06/23 82.6 kg    Assessment and Plan:   1. Severe Aortic Valve Stenosis: He is now s/p TAVR one week ago with placement of a 23 mm Edwards Sapien 3 Ultra Resilia THV from the transfemoral approach. He did well following is procedure. He is NYHA class 2. Groins are stable. Echo one day post-op with normally functioning AVR with no PVL. Will continue ASA 81 mg daily. Will plan echo one month post TAVR. SBE prophylaxis as needed.    2. Coronary artery disease without angina: No chest pain suggestive of angina. CAD followed in the [place]. Continue ASA, beta blocker and statin.     Labs/ tests ordered today include:  No orders of the defined types were placed in this encounter.  Disposition:   F/U will be arranged with the structural team  Signed, Verne Carrow, MD, [facility] 02/14/2024 2:25 PM    [facility] [address] [place], [place], [place]  [address] Phone: [phone]; Fax: [phone]

## 2024-02-14 NOTE — Patient Instructions (Signed)
 Medication Instructions:  No changes *If you need a refill on your cardiac medications before your next appointment, please call your pharmacy*   Lab Work: none   Testing/Procedures: none   Follow-Up: As planned

## 2024-02-15 ENCOUNTER — Telehealth: Payer: Self-pay | Admitting: Physician Assistant

## 2024-02-15 NOTE — Telephone Encounter (Signed)
  [facility]   MULTIDISCIPLINARY HEART VALVE TEAM   Pt underwent TAVR on 02/05/24. He has his transition of care follow up yesterday with Dr. Clifton James and was doing exceptionally well. His VA authorization ends on 03/02/24. He had his 1 month follow up and office visit on 03/12/24. Echo was moved to [facility] on 02/29/24 to be within the window of reimbursement. I will call him after that to do a KCCQ and NYHA class. He will arrange follow up with Deberah Castle [facility] cardiology PA in the near future.) Also, incidentally he was found to have a pulmonary nodule on his pre TAVR CTs that required follow up given long smoking history. I discussed this with the pt and he said this is already closely followed by an oncologist and he is getting CT scans every 6 months. We will call him closer to his 1 year visit and make sure he has an echo within the appropriate window at the [place] and do a telephone visit to check in.   Cline Crock PA-C  [facility]

## 2024-02-28 ENCOUNTER — Other Ambulatory Visit (HOSPITAL_COMMUNITY)

## 2024-02-29 ENCOUNTER — Other Ambulatory Visit (HOSPITAL_COMMUNITY)

## 2024-02-29 ENCOUNTER — Ambulatory Visit (HOSPITAL_COMMUNITY)
Admission: RE | Admit: 2024-02-29 | Discharge: 2024-02-29 | Disposition: A | Discharge status: Home / Self Care | Source: Ambulatory Visit | Attending: Physician Assistant | Admitting: Physician Assistant

## 2024-02-29 DIAGNOSIS — I252 Old myocardial infarction: Secondary | ICD-10-CM | POA: Diagnosis not present

## 2024-02-29 DIAGNOSIS — Z87891 Personal history of nicotine dependence: Secondary | ICD-10-CM | POA: Diagnosis not present

## 2024-02-29 DIAGNOSIS — Z952 Presence of prosthetic heart valve: Secondary | ICD-10-CM | POA: Insufficient documentation

## 2024-02-29 DIAGNOSIS — I10 Essential (primary) hypertension: Secondary | ICD-10-CM | POA: Insufficient documentation

## 2024-02-29 DIAGNOSIS — I2510 Atherosclerotic heart disease of native coronary artery without angina pectoris: Secondary | ICD-10-CM | POA: Diagnosis present

## 2024-02-29 DIAGNOSIS — E785 Hyperlipidemia, unspecified: Secondary | ICD-10-CM | POA: Diagnosis not present

## 2024-02-29 DIAGNOSIS — E119 Type 2 diabetes mellitus without complications: Secondary | ICD-10-CM | POA: Diagnosis not present

## 2024-02-29 DIAGNOSIS — Z48812 Encounter for surgical aftercare following surgery on the circulatory system: Secondary | ICD-10-CM | POA: Insufficient documentation

## 2024-02-29 LAB — ECHOCARDIOGRAM COMPLETE
AR max vel: 1.04 cm2
AV Area VTI: 1.17 cm2
AV Area mean vel: 1.06 cm2
AV Mean grad: 8 mmHg
AV Peak grad: 13.2 mmHg
Ao pk vel: 1.82 m/s
Area-P 1/2: 4.31 cm2
S' Lateral: 2.5 cm

## 2024-02-29 NOTE — Progress Notes (Signed)
*  PRELIMINARY RESULTS* Echocardiogram 2D Echocardiogram has been performed.  Stacey Drain 02/29/2024, 12:42 PM

## 2024-03-10 ENCOUNTER — Telehealth: Payer: Self-pay | Admitting: Physician Assistant

## 2024-03-10 NOTE — Telephone Encounter (Addendum)
  [facility]   MULTIDISCIPLINARY HEART VALVE TEAM  Pt s/p TAVR 02/05/24.  Echo 02/29/24 showed EF 65%, mod asymmetric LVH of the basal segment, normally functioning TAVR with a mean gradient of 8 mm hg and no PVL.   He has NYHA class I symptoms. KCCQ completed below.   [place] Cardiomyopathy Questionnaire     03/10/2024    3:04 PM 11/09/2023   11:29 AM  KCCQ-12  1 a. Ability to shower/bathe Not at all limited Not at all limited  1 b. Ability to walk 1 block Not at all limited Not at all limited  1 c. Ability to hurry/jog Other, Did not do Not at all limited  2. Edema feet/ankles/legs Never over the past 2 weeks Never over the past 2 weeks  3. Limited by fatigue Never over the past 2 weeks 1-2 times a week  4. Limited by dyspnea Never over the past 2 weeks Never over the past 2 weeks  5. Sitting up / on 3+ pillows Never over the past 2 weeks Never over the past 2 weeks  6. Limited enjoyment of life Not limited at all Slightly limited  7. Rest of life w/ symptoms Completely satisfied Mostly dissatisfied  8 a. Participation in hobbies Did not limit at all Did not limit at all  8 b. Participation in chores Did not limit at all Did not limit at all  8 c. Visiting family/friends Did not limit at all Did not limit at all     He had an incidental finding of a pulmonary nodule on pre TAVR CTs and this is being followed by an oncologist at the [facility] with serial CTs. He will do his 1 year follow up echo in 01/2025 at the [place]. I will call him at that time to do KCCQ/NYHA.   Abagail Hoar PA-C  [facility]

## 2024-03-12 ENCOUNTER — Other Ambulatory Visit (HOSPITAL_COMMUNITY)

## 2024-03-12 ENCOUNTER — Ambulatory Visit
